# Patient Record
Sex: Male | Born: 1960 | Race: Black or African American | Hispanic: No | Marital: Married | Smoking: Never smoker
Health system: Southern US, Community
[De-identification: ages and names within clinical notes are randomized; demographics above are authoritative.]

## PROBLEM LIST (undated history)

## (undated) DIAGNOSIS — I10 Essential (primary) hypertension: Secondary | ICD-10-CM

## (undated) DIAGNOSIS — Z972 Presence of dental prosthetic device (complete) (partial): Secondary | ICD-10-CM

## (undated) DIAGNOSIS — M549 Dorsalgia, unspecified: Secondary | ICD-10-CM

## (undated) DIAGNOSIS — G609 Hereditary and idiopathic neuropathy, unspecified: Secondary | ICD-10-CM

## (undated) HISTORY — PX: BACK SURGERY: SHX140

---

## 2004-08-28 ENCOUNTER — Encounter: Payer: Self-pay | Admitting: Orthopedic Surgery

## 2004-09-04 ENCOUNTER — Encounter: Payer: Self-pay | Admitting: Orthopedic Surgery

## 2007-12-19 ENCOUNTER — Ambulatory Visit: Payer: Self-pay

## 2009-10-23 ENCOUNTER — Ambulatory Visit: Payer: Self-pay

## 2009-12-10 ENCOUNTER — Ambulatory Visit: Payer: Self-pay | Admitting: Pain Medicine

## 2009-12-23 ENCOUNTER — Ambulatory Visit: Payer: Self-pay | Admitting: Pain Medicine

## 2009-12-31 ENCOUNTER — Ambulatory Visit: Payer: Self-pay | Admitting: Pain Medicine

## 2010-01-14 ENCOUNTER — Ambulatory Visit: Payer: Self-pay | Admitting: Pain Medicine

## 2010-01-27 ENCOUNTER — Ambulatory Visit: Payer: Self-pay | Admitting: Pain Medicine

## 2010-04-01 ENCOUNTER — Ambulatory Visit: Payer: Self-pay | Admitting: Unknown Physician Specialty

## 2010-04-08 ENCOUNTER — Ambulatory Visit: Payer: Self-pay | Admitting: Unknown Physician Specialty

## 2010-05-14 ENCOUNTER — Encounter: Payer: Self-pay | Admitting: Surgery

## 2010-06-05 ENCOUNTER — Encounter: Payer: Self-pay | Admitting: Surgery

## 2011-02-02 ENCOUNTER — Ambulatory Visit: Payer: Self-pay | Admitting: Unknown Physician Specialty

## 2011-04-29 ENCOUNTER — Ambulatory Visit: Payer: Self-pay | Admitting: Unknown Physician Specialty

## 2011-04-29 LAB — URINALYSIS, COMPLETE
Bacteria: NONE SEEN
Bilirubin,UR: NEGATIVE
Blood: NEGATIVE
Glucose,UR: NEGATIVE mg/dL (ref 0–75)
Leukocyte Esterase: NEGATIVE
Nitrite: NEGATIVE
Ph: 7 (ref 4.5–8.0)
Protein: NEGATIVE
RBC,UR: NONE SEEN /HPF (ref 0–5)
Specific Gravity: 1.023 (ref 1.003–1.030)
Squamous Epithelial: <1

## 2011-04-29 LAB — BASIC METABOLIC PANEL
Anion Gap: 7 (ref 7–16)
Calcium, Total: 9.5 mg/dL (ref 8.5–10.1)
Chloride: 102 mmol/L (ref 98–107)
EGFR (African American): >60
EGFR (Non-African Amer.): >60
Glucose: 112 mg/dL — ABNORMAL HIGH (ref 65–99)
Osmolality: 283 (ref 275–301)
Potassium: 4.7 mmol/L (ref 3.5–5.1)
Sodium: 141 mmol/L (ref 136–145)

## 2011-04-29 LAB — CBC
HCT: 47.3 % (ref 40.0–52.0)
HGB: 15.8 g/dL (ref 13.0–18.0)
RBC: 5.53 10*6/uL (ref 4.40–5.90)
RDW: 12.6 % (ref 11.5–14.5)
WBC: 5.7 10*3/uL (ref 3.8–10.6)

## 2011-05-12 ENCOUNTER — Inpatient Hospital Stay: Payer: Self-pay | Admitting: Unknown Physician Specialty

## 2011-08-01 ENCOUNTER — Emergency Department: Payer: Self-pay | Admitting: Internal Medicine

## 2011-09-24 ENCOUNTER — Ambulatory Visit: Payer: Self-pay | Admitting: Orthopedic Surgery

## 2011-09-24 LAB — CREATININE, SERUM
Creatinine: 1.26 mg/dL (ref 0.60–1.30)
EGFR (Non-African Amer.): >60

## 2011-11-25 ENCOUNTER — Ambulatory Visit: Payer: Self-pay | Admitting: Pain Medicine

## 2011-12-04 ENCOUNTER — Ambulatory Visit: Payer: Self-pay | Admitting: Pain Medicine

## 2011-12-04 LAB — CREATININE, SERUM
Creatinine: 1.36 mg/dL — ABNORMAL HIGH (ref 0.60–1.30)
EGFR (African American): >60

## 2011-12-28 ENCOUNTER — Ambulatory Visit: Payer: Self-pay | Admitting: Pain Medicine

## 2012-01-26 ENCOUNTER — Ambulatory Visit: Payer: Self-pay | Admitting: Pain Medicine

## 2012-03-14 ENCOUNTER — Ambulatory Visit: Payer: Self-pay | Admitting: Pain Medicine

## 2012-03-20 ENCOUNTER — Ambulatory Visit: Payer: Self-pay | Admitting: Emergency Medicine

## 2012-03-20 LAB — RAPID STREP-A WITH REFLX: Micro Text Report: NEGATIVE

## 2012-03-22 LAB — BETA STREP CULTURE(ARMC)

## 2012-04-28 ENCOUNTER — Ambulatory Visit: Payer: Self-pay | Admitting: Pain Medicine

## 2012-05-16 ENCOUNTER — Ambulatory Visit: Payer: Self-pay | Admitting: Pain Medicine

## 2012-08-01 DIAGNOSIS — B182 Chronic viral hepatitis C: Secondary | ICD-10-CM | POA: Insufficient documentation

## 2012-09-12 DIAGNOSIS — M545 Low back pain: Secondary | ICD-10-CM | POA: Insufficient documentation

## 2012-09-12 DIAGNOSIS — G894 Chronic pain syndrome: Secondary | ICD-10-CM | POA: Insufficient documentation

## 2012-11-22 ENCOUNTER — Emergency Department: Payer: Self-pay | Admitting: Emergency Medicine

## 2012-12-08 ENCOUNTER — Emergency Department: Payer: Self-pay | Admitting: Emergency Medicine

## 2013-03-20 ENCOUNTER — Emergency Department: Payer: Self-pay | Admitting: Emergency Medicine

## 2013-03-24 ENCOUNTER — Emergency Department: Payer: Self-pay | Admitting: Emergency Medicine

## 2013-04-17 ENCOUNTER — Ambulatory Visit: Payer: Self-pay | Admitting: Pain Medicine

## 2013-05-01 ENCOUNTER — Other Ambulatory Visit: Payer: Self-pay | Admitting: Pain Medicine

## 2013-05-01 LAB — MAGNESIUM: MAGNESIUM: 1.9 mg/dL

## 2013-06-06 ENCOUNTER — Ambulatory Visit: Payer: Self-pay | Admitting: Pain Medicine

## 2013-11-22 ENCOUNTER — Emergency Department: Payer: Self-pay | Admitting: Emergency Medicine

## 2014-01-12 ENCOUNTER — Ambulatory Visit: Payer: Self-pay | Admitting: Orthopedic Surgery

## 2014-07-29 NOTE — Discharge Summary (Signed)
PATIENT NAME:  Seth Brown, Seth Brown MR#:  [id number] DATE OF BIRTH:  1960-12-31  DATE OF ADMISSION:  05/12/2011 DATE OF DISCHARGE:  05/14/2011  ADMITTING DIAGNOSIS: Degenerative disk disease with spondylolisthesis and spinal stenosis at L4-L5.   DISCHARGE DIAGNOSIS: Degenerative disk disease with spondylolisthesis and spinal stenosis at L4-L5.   OPERATION: On 05/12/2011, the patient had a transverse lumbar interbody fusion L4-L5 with insertion of an interbody device and nonsegmental spinal instrumentation with aspiration of iliac bone graft.   SURGEON: Ruthann Cancer, M.D.   ASSISTANT: Dedra Skeens, PA-C.   ANESTHESIA: General.   ESTIMATED BLOOD LOSS: 450 ML.  REPLACEMENT: 1,200 mL of crystalloid with 250 mL from cell saver.  COMPLICATIONS: None.  IMPLANTS USED: Zimmer trabecular metal Ardis 11 mm implant, CopiOs bone void filler, ST 360 degree pedicle screw with top-loading rods.   HISTORY: The patient is a 54 year old male who presented for persistent back pain and pain in the right leg and occasionally down the left. The patient is status post L4-L5 right microdiskectomy and L3-L4 diskectomy and decompression on 04/09/2011. The patient has been refractory to conservative management and continued to have pain.   PHYSICAL EXAMINATION: GENERAL: An alert male with some difficulties with transfers and mild limping component on the right. Cardiac examination was normal. Chest is clear. MUSCULOSKELETAL: In regard to the lumbar spine, the patient has decreased range of motion with pain with lateral rotation and bend and extension. The patient has pain with extension to his lower extremities. Bilateral lower extremity: the patient has positive straight leg raise at 45 degrees on the right, negative on the left. The patient has decreased sensation at the L5 nerve distribution on the right.   HOSPITAL COURSE: After initial admission on 05/12/2011, the patient was brought to the orthopedic floor. On  postoperative day one, the patient had a hemoglobin 13.4 which was stable. The patient worked with physical therapy ambulating to the bathroom and then ambulating up to 70 feet on day one that afternoon. The patient was ready to go home with home health physical therapy at least for one visit for safety check and using his back brace for ambulation.   DISCHARGE INSTRUCTIONS:  1. The patient will follow up at [facility] in about six days.  2. The patient will do physical therapy for at least one visit for safety and evaluation of the home. 3. The patient will do weight bear as tolerated. The patient will avoid bending, lifting, and twisting and avoid sitting upright and not crossing his legs.  4. The patient will try not to reach below his knees without twisting.  5. The patient will use knee-high TED hose.  6. The patient's diet is regular.  7. The patient is to leave his dressing on, but is able to shower and try not to get water under the dressing.  8. The patient will call the clinic if there is any bright red bleeding, calf pain, bowel or bladder difficulty, or any fever greater than 101.5.   DISCHARGE MEDICATIONS:  1. Os-Cal with vitamin D 1 tablet with each meal.  2. Resume typical home medications. 3. Tylenol 650/1,000 milligrams every six hours as needed for pain.  4. Oxycodone 10 mg every four hours as needed for severe pain.  5. The patient will also wear his lumbar support when he is up and ambulating.    ____________________________ J. Dedra Skeens, [place] jtm:ap D: 05/14/2011 07:42:07 ET T: 05/14/2011 08:11:24 ET JOB#: [id number]  cc: J. Dedra Skeens,  PA, <Dictator> J Madelina Sanda PA ELECTRONICALLY SIGNED 05/23/2011 5:55

## 2014-07-29 NOTE — Op Note (Signed)
PATIENT NAME:  Seth Brown, Seth Brown MR#:  [id number] DATE OF BIRTH:  Jun 17, 1960  DATE OF PROCEDURE:  05/12/2011  PREOPERATIVE DIAGNOSIS: Degenerative disk disease with spondylolisthesis and spinal stenosis at L4-L5.   POSTOPERATIVE DIAGNOSIS: Degenerative disk disease with spondylolisthesis and spinal stenosis at L4-L5.   PROCEDURES PERFORMED:  1. Transverse lumbar interbody fusion, L4-L5. 2. Posterolateral fusion, L4-L5.  3. Insertion of interbody device, L4-L5.  4. Nonsegmental spinal instrumentation, L4-L5.  5. Aspiration of iliac crest bone marrow from right iliac crest for fusion mass.   SURGEON: Winn Jock. Gerrit Heck, M.D.   ASSISTANT: Dedra Skeens, PA-C  ANESTHESIA: General.   ESTIMATED BLOOD LOSS: 450 mL.  REPLACEMENT: 1200 mL crystalloid, 250 mL from Cell Saver.   COMPLICATIONS: None.   IMPLANTS USED: Zimmer trabecular metal Ardis 11 mm implant, CopiOs bone void filler, ST 360 pedicle screws with top-loading rods.  Brief Clinical Note And Pathology: The patient had persistent back and radiating leg pain. He had had a prior procedure at L4-L5. Work-up showed evidence of progressive degenerative change with instability. Options, risks, and benefits were discussed in detail. At the time of the procedure, there was marked compression of the L5 nerve roots bilaterally. This was a combination of disk bulge and facet hypertrophy. The patient's bone was of good quality.   DESCRIPTION OF PROCEDURE: Preop antibiotics, adequate general anesthesia, prone position, all prominences well padded. Routine prepping and draping. Appropriate time out was called. The area of the incision was identified and opened in line from the prior incision with x-ray.  A midline incision was made. Subperiosteal dissection was performed. Self-retaining retractors were placed. Dissection was performed with the cautery and the Harmonic scalpel.   The self-retaining retractors were positioned throughout the procedure.    Laminectomy was performed at L4-L5. The facet joint on the left side was removed. Partial facetectomy and foraminotomy was performed on the right side. Decompression was performed bilaterally.   The disk space was then entered. Cleaning was performed with the pituitaries. Sizing was then performed with the sizer/endplate shaver. Size 11 gave excellent endplate to endplate contact. The 11 trial was used. Endplate shaving was performed.   Attention was then turned to the right iliac crest where using a bone marrow aspiration set bone marrow was aspirated. This was then mixed with the CopiOs, placed anteriorly in the disk space, and the Ardis implant was then inserted while protecting the soft tissues.   The incision was thoroughly irrigated. The nerve roots were again inspected.   The transverse processes were then inspected and cleaned. The pedicle was then inserted using the bur to began opening the pedicle followed by use of the PediGuard. The PediGuard advanced nicely, checked with fluoroscopic imaging.   Sounding was then performed. The pedicle screws were inserted at each level. All had excellent purchase. Position was good, checked on AP and lateral views.   Attention was then turned to the top-loading rods which were placed without difficulty. The transverse processes which were decorticated were then covered with CopiOs that had been soaked in bone marrow aspirate. The bone which had been removed locally was placed in the bone mill and morselized. This was then packed into the posterolateral gutters. The incision was thoroughly irrigated throughout and at the end of the procedure. The exposed dura and nerve roots were carefully inspected, hemostasis was good. These were then covered with Surgiflo. The fascia was closed with #2 quill. The subcutaneous was closed with zero quill, and the skin was closed with  staples. A soft sterile dressing was applied. Sponge, instrument, and needle     counts were reported as correct prior to and after wound closure. The patient was awakened and taken to the PAC-U having tolerated the procedure well.  ____________________________ Winn JockJames C. Gerrit Heckaliff, MD jcc:slb D: 05/12/2011 13:22:42 ET T: 05/12/2011 14:32:49 ET JOB#: [id number]  cc: Winn JockJames C. Gerrit Heckaliff, MD, <Dictator> Winn JockJAMES C Shaunak Kreis MD ELECTRONICALLY SIGNED 05/19/2011 19:33

## 2014-09-21 ENCOUNTER — Emergency Department
Admission: EM | Admit: 2014-09-21 | Discharge: 2014-09-21 | Disposition: A | Discharge status: Home / Self Care | Payer: Medicaid Other | Attending: Emergency Medicine | Admitting: Emergency Medicine

## 2014-09-21 ENCOUNTER — Encounter: Payer: Self-pay | Admitting: Emergency Medicine

## 2014-09-21 DIAGNOSIS — F1010 Alcohol abuse, uncomplicated: Secondary | ICD-10-CM | POA: Diagnosis not present

## 2014-09-21 DIAGNOSIS — F1410 Cocaine abuse, uncomplicated: Secondary | ICD-10-CM | POA: Insufficient documentation

## 2014-09-21 DIAGNOSIS — F1910 Other psychoactive substance abuse, uncomplicated: Secondary | ICD-10-CM

## 2014-09-21 LAB — URINE DRUG SCREEN, QUALITATIVE (ARMC ONLY)
Amphetamines, Ur Screen: NOT DETECTED
Barbiturates, Ur Screen: NOT DETECTED
Benzodiazepine, Ur Scrn: NOT DETECTED
CANNABINOID 50 NG, UR ~~LOC~~: NOT DETECTED
COCAINE METABOLITE, UR ~~LOC~~: POSITIVE — AB
MDMA (Ecstasy)Ur Screen: NOT DETECTED
Methadone Scn, Ur: NOT DETECTED
OPIATE, UR SCREEN: NOT DETECTED
Phencyclidine (PCP) Ur S: NOT DETECTED
Tricyclic, Ur Screen: NOT DETECTED

## 2014-09-21 LAB — COMPREHENSIVE METABOLIC PANEL
ALT: 95 U/L — AB (ref 17–63)
AST: 99 U/L — ABNORMAL HIGH (ref 15–41)
Albumin: 4.5 g/dL (ref 3.5–5.0)
Alkaline Phosphatase: 47 U/L (ref 38–126)
Anion gap: 9 (ref 5–15)
BILIRUBIN TOTAL: 0.9 mg/dL (ref 0.3–1.2)
BUN: 10 mg/dL (ref 6–20)
CALCIUM: 9.4 mg/dL (ref 8.9–10.3)
CHLORIDE: 106 mmol/L (ref 101–111)
CO2: 25 mmol/L (ref 22–32)
Creatinine, Ser: 1.24 mg/dL (ref 0.61–1.24)
GFR calc Af Amer: >60 mL/min (ref >60)
Glucose, Bld: 142 mg/dL — ABNORMAL HIGH (ref 65–99)
POTASSIUM: 3.9 mmol/L (ref 3.5–5.1)
SODIUM: 140 mmol/L (ref 135–145)
Total Protein: 8 g/dL (ref 6.5–8.1)

## 2014-09-21 LAB — CBC
HEMATOCRIT: 45.4 % (ref 40.0–52.0)
Hemoglobin: 14.7 g/dL (ref 13.0–18.0)
MCH: 27.4 pg (ref 26.0–34.0)
MCHC: 32.4 g/dL (ref 32.0–36.0)
MCV: 84.8 fL (ref 80.0–100.0)
Platelets: 256 10*3/uL (ref 150–440)
RBC: 5.35 MIL/uL (ref 4.40–5.90)
RDW: 13.8 % (ref 11.5–14.5)
WBC: 6.7 10*3/uL (ref 3.8–10.6)

## 2014-09-21 LAB — ETHANOL: Alcohol, Ethyl (B): 6 mg/dL — ABNORMAL HIGH (ref <5)

## 2014-09-21 NOTE — Discharge Instructions (Signed)
Alcohol Withdrawal °Anytime drug use is interfering with normal living activities it has become abuse. This includes problems with family and friends. Psychological dependence has developed when your mind tells you that the drug is needed. This is usually followed by physical dependence when a continuing increase of drugs are required to get the same feeling or "high." This is known as addiction or chemical dependency. A person's risk is much higher if there is a history of chemical dependency in the family. °Mild Withdrawal Following Stopping Alcohol, When Addiction or Chemical Dependency Has Developed °When a person has developed tolerance to alcohol, any sudden stopping of alcohol can cause uncomfortable physical symptoms. Most of the time these are mild and consist of tremors in the hands and increases in heart rate, breathing, and temperature. Sometimes these symptoms are associated with anxiety, panic attacks, and bad dreams. There may also be stomach upset. Normal sleep patterns are often interrupted with periods of inability to sleep (insomnia). This may last for 6 months. Because of this discomfort, many people choose to continue drinking to get rid of this discomfort and to try to feel normal. °Severe Withdrawal with Decreased or No Alcohol Intake, When Addiction or Chemical Dependency Has Developed °About five percent of alcoholics will develop signs of severe withdrawal when they stop using alcohol. One sign of this is development of generalized seizures (convulsions). Other signs of this are severe agitation and confusion. This may be associated with believing in things which are not real or seeing things which are not really there (delusions and hallucinations). Vitamin deficiencies are usually present if alcohol intake has been long-term. Treatment for this most often requires hospitalization and close observation. °Addiction can only be helped by stopping use of all chemicals. This is hard but may  save your life. With continual alcohol use, possible outcomes are usually loss of self respect and esteem, violence, and death. °Addiction cannot be cured but it can be stopped. This often requires outside help and the care of professionals. Treatment centers are listed in the yellow pages under Cocaine, Narcotics, and Alcoholics Anonymous. Most hospitals and clinics can refer you to a specialized care center. °It is not necessary for you to go through the uncomfortable symptoms of withdrawal. Your caregiver can provide you with medicines that will help you through this difficult period. Try to avoid situations, friends, or drugs that made it possible for you to keep using alcohol in the past. Learn how to say no. °It takes a long period of time to overcome addictions to all drugs, including alcohol. There may be many times when you feel as though you want a drink. After getting rid of the physical addiction and withdrawal, you will have a lessening of the craving which tells you that you need alcohol to feel normal. Call your caregiver if more support is needed. Learn who to talk to in your family and among your friends so that during these periods you can receive outside help. Alcoholics Anonymous (AA) has helped many people over the years. To get further help, contact AA or call your caregiver, counselor, or clergyperson. Al-Anon and Alateen are support groups for friends and family members of an alcoholic. The people who love and care for an alcoholic often need help, too. For information about these organizations, check your phone directory or call a local alcoholism treatment center.  °SEEK IMMEDIATE MEDICAL CARE IF:  °· You have a seizure. °· You have a fever. °· You experience uncontrolled vomiting or you   vomit up blood. This may be bright red or look like black coffee grounds. °· You have blood in the stool. This may be bright red or appear as a black, tarry, bad-smelling stool. °· You become lightheaded or  faint. Do not drive if you feel this way. Have someone else drive you or call 911 for help. °· You become more agitated or confused. °· You develop uncontrolled anxiety. °· You begin to see things that are not really there (hallucinate). °Your caregiver has determined that you completely understand your medical condition, and that your mental state is back to normal. You understand that you have been treated for alcohol withdrawal, have agreed not to drink any alcohol for a minimum of 1 day, will not operate a car or other machinery for 24 hours, and have had an opportunity to ask any questions about your condition. °Document Released: 12/31/2004 Document Revised: 06/15/2011 Document Reviewed: 11/09/2007 °ExitCare® Patient Information ©2015 [employer]. This information is not intended to replace advice given to you by your health care provider. Make sure you discuss any questions you have with your health care provider. ° °

## 2014-09-21 NOTE — ED Notes (Signed)
BEHAVIORAL HEALTH ROUNDING Patient sleeping: Yes.   Patient alert and oriented: yes Behavior appropriate: Yes.  ; If no, describe:  Nutrition and fluids offered: No Toileting and hygiene offered: No Sitter present: yes Law enforcement present: Yes  

## 2014-09-21 NOTE — ED Notes (Signed)
BEHAVIORAL HEALTH ROUNDING Patient sleeping: No. Patient alert and oriented: yes Behavior appropriate: Yes.  ; If no, describe:  Nutrition and fluids offered: Yes  Toileting and hygiene offered: Yes  Sitter present: yes Law enforcement present: Yes  

## 2014-09-21 NOTE — ED Notes (Signed)
Pt informed to return if any life threatening symptoms occur. Also informed to return if pt began to have any SI/HI.

## 2014-09-21 NOTE — ED Notes (Signed)
States he wants detox from crack.  Skin w/d, cooperative.

## 2014-09-21 NOTE — BH Assessment (Signed)
Assessment Note  Seth Brown is an 54 y.o. male who presents to ER seeking assistance with his alcohol and cocaine use. He states, he uses on a daily basis and the longest he has been clean is 90 days. He was on treatment in the past but didn't follow up without patient treatment. He denies having any medical problems or concerns. Has no history of blackout or seizures. Current withdrawal symptoms are; shakes, some dizziness, cold chills and nausea.   He denies any involvement with the legal system. He also denies SI/HI and AV/H.      Axis I: Alcohol Abuse and Substance Abuse Axis III: History reviewed. No pertinent past medical history. Axis IV: economic problems, occupational problems, other psychosocial or environmental problems, problems related to social environment and problems with primary support group  Past Medical History: History reviewed. No pertinent past medical history.  Past Surgical History  Procedure Laterality Date  . Back surgery      Family History: History reviewed. No pertinent family history.  Social History:  reports that he has never smoked. He does not have any smokeless tobacco history on file. He reports that he drinks alcohol. His drug history is not on file.  Additional Social History:  Alcohol / Drug Use Pain Medications: None Reported Prescriptions: None Reported Over the Counter: None Reported History of alcohol / drug use?: Yes Longest period of sobriety (when/how long): None Reported Negative Consequences of Use: Financial, Personal relationships, Work / Programmer, multimedia Withdrawal Symptoms: Agitation, Sweats, Fever / Chills, Nausea / Vomiting Substance #1 Name of Substance 1: Cocaine 1 - Age of First Use: 20 1 - Amount (size/oz): "8 Ball" 1 - Frequency: Daily 1 - Duration: 24 years 1 - Last Use / Amount: 09/20/2014 Substance #2 Name of Substance 2: Alcohol 2 - Age of First Use: 18 2 - Amount (size/oz): 3 to 4, 6 packs 2 - Frequency:  Daily 2 - Duration: 20 years 2 - Last Use / Amount: 09/20/2014  CIWA: CIWA-Ar BP: (!) 130/91 mmHg Pulse Rate: 80 COWS:    Allergies: Allergies no known allergies  Home Medications:  (Not in a hospital admission)  OB/GYN Status:  No LMP for male patient.  General Assessment Data Location of Assessment: [facility] ED TTS Assessment: In system Is this a Tele or Face-to-Face Assessment?: Face-to-Face Is this an Initial Assessment or a Re-assessment for this encounter?: Initial Assessment Marital status: Married [place] name: n/a Is patient pregnant?: No Pregnancy Status: No Living Arrangements: Spouse/significant other Can pt return to current living arrangement?: Yes Admission Status: Voluntary Is patient capable of signing voluntary admission?: Yes Referral Source: Self/Family/Friend Insurance type: Medicaid  Medical Screening Exam [facility] Walk-in ONLY) Medical Exam completed: Yes  Crisis Care Plan Living Arrangements: Spouse/significant other Name of Psychiatrist: n/a Name of Therapist: n/a  Education Status Is patient currently in school?: No Current Grade: n/a Highest grade of school patient has completed: 12 Name of school: n/a Contact person: n/a  Risk to self with the past 6 months Suicidal Ideation: No Has patient been a risk to self within the past 6 months prior to admission? : No Suicidal Intent: No Has patient had any suicidal intent within the past 6 months prior to admission? : No Is patient at risk for suicide?: No Suicidal Plan?: No Has patient had any suicidal plan within the past 6 months prior to admission? : No Access to Means: No What has been your use of drugs/alcohol within the last 12 months?: Cocaine &  Alcohol Previous Attempts/Gestures: No How many times?: 0 Other Self Harm Risks: n/a Triggers for Past Attempts: None known Intentional Self Injurious Behavior: None Family Suicide History: No Recent stressful life event(s): Conflict (Comment),  Other (Comment) Persecutory voices/beliefs?: No Depression: No Depression Symptoms: Feeling worthless/self pity, Feeling angry/irritable, Guilt Substance abuse history and/or treatment for substance abuse?: Yes ([facility] 2006) Suicide prevention information given to non-admitted patients: Not applicable  Risk to Others within the past 6 months Homicidal Ideation: No Does patient have any lifetime risk of violence toward others beyond the six months prior to admission? : No Thoughts of Harm to Others: No Current Homicidal Intent: No Current Homicidal Plan: No Access to Homicidal Means: No Identified Victim: None Reported History of harm to others?: No Assessment of Violence: None Noted Violent Behavior Description: None Reported Does patient have access to weapons?: No Criminal Charges Pending?: No Does patient have a court date: No Is patient on probation?: No  Psychosis Hallucinations: None noted Delusions: None noted  Mental Status Report Appearance/Hygiene: Unremarkable, In scrubs, In hospital gown Eye Contact: Good Motor Activity: Freedom of movement Speech: Logical/coherent Level of Consciousness: Alert, Irritable Mood: Anxious, Irritable, Pleasant Affect: Anxious, Depressed, Appropriate to circumstance Anxiety Level: Severe Thought Processes: Coherent, Relevant Judgement: Unimpaired Orientation: Person, Place, Time, Situation, Appropriate for developmental age Obsessive Compulsive Thoughts/Behaviors: None  Cognitive Functioning Concentration: Normal Memory: Remote Intact, Recent Intact IQ: Average Insight: Poor Impulse Control: Poor Appetite: Fair Weight Loss: 0 Weight Gain: 0 Sleep: No Change Total Hours of Sleep: 8 Vegetative Symptoms: None  ADLScreening [facility] Assessment Services) Patient's cognitive ability adequate to safely complete daily activities?: Yes Patient able to express need for assistance with ADLs?: Yes Independently performs ADLs?: Yes  (appropriate for developmental age)  Prior Inpatient Therapy Prior Inpatient Therapy: Yes Prior Therapy Dates: 2006 Prior Therapy Facilty/Provider(s): [facility] Reason for Treatment: SA Treatment  Prior Outpatient Therapy Prior Outpatient Therapy: No Prior Therapy Dates: n/a Prior Therapy Facilty/Provider(s): n/a Reason for Treatment: n/a Does patient have an ACCT team?: No Does patient have Intensive In-House Services?  : No Does patient have [facility] services? : No Does patient have P4CC services?: No  ADL Screening (condition at time of admission) Patient's cognitive ability adequate to safely complete daily activities?: Yes Patient able to express need for assistance with ADLs?: Yes Independently performs ADLs?: Yes (appropriate for developmental age)       Abuse/Neglect Assessment (Assessment to be complete while patient is alone) Physical Abuse: Denies Verbal Abuse: Denies Sexual Abuse: Denies Exploitation of patient/patient's resources: Denies Self-Neglect: Denies Values / Beliefs Cultural Requests During Hospitalization: None Spiritual Requests During Hospitalization: None Consults Spiritual Care Consult Needed: No Social Work Consult Needed: No Merchant navy officer (For Healthcare) Does patient have an advance directive?: No    Additional Information 1:1 In Past 12 Months?: No CIRT Risk: No Elopement Risk: No Does patient have medical clearance?: Yes  Child/Adolescent Assessment Running Away Risk: Denies (Pt. is an adult)  Disposition:  Disposition Initial Assessment Completed for this Encounter: Yes Disposition of Patient: Referred to Patient referred to: [facility]  On Site Evaluation by:   Reviewed with Physician:    Lilyan Gilford, MS, LCAS, LPC, NCC, CCSI 09/21/2014 2:26 PM

## 2014-09-21 NOTE — ED Provider Notes (Signed)
[facility] Emergency Department Provider Note  Time seen: 9:11 AM  I have reviewed the triage vital signs and the nursing notes.   HISTORY  Chief Complaint Drug Problem    HPI Harvie Morua is a 54 y.o. male with no past medical history presents the emergency department, hoping to detox off alcohol and cocaine. According to the patient he has been smoking crack cocaine every day for several weeks now. 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 He also states daily drinking, approximately 6-12 beers per day. Denies any history of DTs or seizures. Patient's last use of crack cocaine and alcohol was last night. Patient denies any SI or HI, here voluntarily hoping for detox.     History reviewed. No pertinent past medical history.  There are no active problems to display for this patient.   Past Surgical History  Procedure Laterality Date  . Back surgery      No current outpatient prescriptions on file.  Allergies Review of patient's allergies indicates no known allergies.  History reviewed. No pertinent family history.  Social History History  Substance Use Topics  . Smoking status: Never Smoker   . Smokeless tobacco: Not on file  . Alcohol Use: Yes    Review of Systems Constitutional: Negative for fever. Cardiovascular: Negative for chest pain. Respiratory: Negative for shortness of breath. Gastrointestinal: Negative for abdominal pain Musculoskeletal: Negative for back pain. Neurological: Negative for headache Psychiatric:Denies SI or HI.  10-point ROS otherwise negative.  ____________________________________________   PHYSICAL EXAM:  VITAL SIGNS: ED Triage Vitals  Enc Vitals Group     BP 09/21/14 0845 130/91 mmHg     Pulse Rate 09/21/14 0845 80     Resp 09/21/14 0845 16     Temp 09/21/14 0845 98.1 F (36.7 C)     Temp Source 09/21/14 0845 Oral     SpO2 09/21/14 0845 95 %     Weight 09/21/14 0845 180 lb  (81.647 kg)     Height 09/21/14 0845  (1.778 m)     Head Cir --      Peak Flow --      Pain Score --      Pain Loc --      Pain Edu? --      Excl. in GC? --     Constitutional: Alert and oriented. Well appearing and in no distress. Eyes: Normal exam ENT   Head: Normocephalic and atraumatic. Cardiovascular: Normal rate, regular rhythm. No murmur Respiratory: Normal respiratory effort without tachypnea nor retractions. Breath sounds are clear Gastrointestinal: Soft and nontender. No distention.  Musculoskeletal: Nontender with normal range of motion in all extremities.  Neurologic:  Normal speech and language. No gross focal neurologic deficits Skin:  Skin is warm, dry and intact.  Psychiatric: Mood and affect are normal. Speech and behavior are normal.  ____________________________________________    INITIAL IMPRESSION / ASSESSMENT AND PLAN / ED COURSE  Pertinent labs & imaging results that were available during my care of the patient were reviewed by me and considered in my medical decision making (see chart for details).  Patient with no medical problems presents to the emergency department hoping for detox from alcohol and crack cocaine. We will check labs, and discuss with behavioral health for detox availability. Patient agreeable to plan, will place on [place] precautions at this time.  Patient no longer wishes for detox placement, and is asking to be discharged home. We'll discharge the patient  with primary care follow-up.  ____________________________________________   FINAL CLINICAL IMPRESSION(S) / ED DIAGNOSES  Alcohol abuse Substance abuse   Minna Antis, MD 09/21/14 1504

## 2014-12-30 ENCOUNTER — Emergency Department
Admission: EM | Admit: 2014-12-30 | Discharge: 2014-12-30 | Disposition: A | Discharge status: Home / Self Care | Payer: Medicaid Other | Attending: Emergency Medicine | Admitting: Emergency Medicine

## 2014-12-30 ENCOUNTER — Emergency Department: Payer: Medicaid Other

## 2014-12-30 ENCOUNTER — Encounter: Payer: Self-pay | Admitting: *Deleted

## 2014-12-30 DIAGNOSIS — Y9389 Activity, other specified: Secondary | ICD-10-CM | POA: Insufficient documentation

## 2014-12-30 DIAGNOSIS — X58XXXA Exposure to other specified factors, initial encounter: Secondary | ICD-10-CM | POA: Diagnosis not present

## 2014-12-30 DIAGNOSIS — Y998 Other external cause status: Secondary | ICD-10-CM | POA: Diagnosis not present

## 2014-12-30 DIAGNOSIS — Y9289 Other specified places as the place of occurrence of the external cause: Secondary | ICD-10-CM | POA: Insufficient documentation

## 2014-12-30 DIAGNOSIS — S46011A Strain of muscle(s) and tendon(s) of the rotator cuff of right shoulder, initial encounter: Secondary | ICD-10-CM | POA: Diagnosis not present

## 2014-12-30 DIAGNOSIS — Z791 Long term (current) use of non-steroidal anti-inflammatories (NSAID): Secondary | ICD-10-CM | POA: Diagnosis not present

## 2014-12-30 DIAGNOSIS — S4991XA Unspecified injury of right shoulder and upper arm, initial encounter: Secondary | ICD-10-CM | POA: Diagnosis present

## 2014-12-30 MED ORDER — MELOXICAM 15 MG PO TABS: 15.0000 mg | ORAL_TABLET | Freq: Every day | ORAL | Status: DC

## 2014-12-30 NOTE — ED Provider Notes (Signed)
[facility] Emergency Department Provider Note  ____________________________________________  Time seen: Approximately 7:48 AM  I have reviewed the triage vital signs and the nursing notes.   HISTORY  Chief Complaint Shoulder Pain   HPI Seth Brown is a 54 y.o. male who presents to the emergency department for evaluation of anterior right shoulder pain. He denies specific injury, however states that he has been lifting weights recently and believes it may be related. He denies previous shoulder injuries.   No past medical history on file.  There are no active problems to display for this patient.   Past Surgical History  Procedure Laterality Date  . Back surgery      Current Outpatient Rx  Name  Route  Sig  Dispense  Refill  . naproxen sodium (ANAPROX) 220 MG tablet   Oral   Take 220 mg by mouth 3 (three) times daily with meals.         . meloxicam (MOBIC) 15 MG tablet   Oral   Take 1 tablet (15 mg total) by mouth daily.   30 tablet   2     Allergies Review of patient's allergies indicates no known allergies.  No family history on file.  Social History Social History  Substance Use Topics  . Smoking status: Never Smoker   . Smokeless tobacco: Never Used  . Alcohol Use: No    Review of Systems Constitutional: No recent illness. Eyes: No visual changes. ENT: No sore throat. Cardiovascular: Denies chest pain or palpitations. Respiratory: Denies shortness of breath. Gastrointestinal: No abdominal pain.  Genitourinary: Negative for dysuria. Musculoskeletal: Pain in right shoulder Skin: Negative for rash. Neurological: Negative for headaches, focal weakness or numbness. 10-point ROS otherwise negative.  ____________________________________________   PHYSICAL EXAM:  VITAL SIGNS: ED Triage Vitals  Enc Vitals Group     BP 12/30/14 0616 128/96 mmHg     Pulse Rate 12/30/14 0616 66     Resp 12/30/14 0616 16     Temp  12/30/14 0616 97.7 F (36.5 C)     Temp Source 12/30/14 0616 Oral     SpO2 12/30/14 0616 100 %     Weight 12/30/14 0616 178 lb (80.74 kg)     Height 12/30/14 0616  (1.778 m)     Head Cir --      Peak Flow --      Pain Score 12/30/14 0616 9     Pain Loc --      Pain Edu? --      Excl. in GC? --     Constitutional: Alert and oriented. Well appearing and in no acute distress. Eyes: Conjunctivae are normal. EOMI. Head: Atraumatic. Nose: No congestion/rhinnorhea. Neck: No stridor.  Respiratory: Normal respiratory effort.   Musculoskeletal: Anterior shoulder pain worse with abduction. No stepdown deformity noted Neurologic:  Normal speech and language. No gross focal neurologic deficits are appreciated. Speech is normal. No gait instability. Skin:  Skin is warm, dry and intact. Atraumatic. Psychiatric: Mood and affect are normal. Speech and behavior are normal.  ____________________________________________   LABS (all labs ordered are listed, but only abnormal results are displayed)  Labs Reviewed - No data to display ____________________________________________  RADIOLOGY  Not indicated ____________________________________________   PROCEDURES  Procedure(s) performed: None   ____________________________________________   INITIAL IMPRESSION / ASSESSMENT AND PLAN / ED COURSE  Pertinent labs & imaging results that were available during my care of the patient were reviewed by me and considered in  my medical decision making (see chart for details).  Patient was advised to decrease the amount of weight he is lifting on the right side until pain is relieved or he is cleared by orthopedics. He was advised to follow-up with orthopedics for symptoms that are not improving over the next week. He'll be given a prescription for Mobic. ____________________________________________   FINAL CLINICAL IMPRESSION(S) / ED DIAGNOSES  Final diagnoses:  Rotator cuff strain, right,  initial encounter       Chinita Pester, FNP 12/30/14 1541  Sharman Cheek, MD 12/30/14 [phone]

## 2014-12-30 NOTE — ED Notes (Signed)
Pt states he believes he injured R shoulder when weight lifting last Saturday. Pt states pain not improving and no relief from OTC medications like Aleve or topical preparations like Icyhot. Pt states pain is worse when crossing his R arm over his midline, raising it straight above his head or putting pressure on his arm in a vertical direction.

## 2015-04-10 DIAGNOSIS — I10 Essential (primary) hypertension: Secondary | ICD-10-CM | POA: Insufficient documentation

## 2015-09-11 ENCOUNTER — Telehealth: Payer: Self-pay | Admitting: Emergency Medicine

## 2015-12-25 ENCOUNTER — Emergency Department
Admission: EM | Admit: 2015-12-25 | Discharge: 2015-12-25 | Disposition: A | Discharge status: Home / Self Care | Payer: Medicaid Other | Attending: Emergency Medicine | Admitting: Emergency Medicine

## 2015-12-25 ENCOUNTER — Encounter: Payer: Self-pay | Admitting: *Deleted

## 2015-12-25 DIAGNOSIS — M7711 Lateral epicondylitis, right elbow: Secondary | ICD-10-CM | POA: Insufficient documentation

## 2015-12-25 DIAGNOSIS — M25521 Pain in right elbow: Secondary | ICD-10-CM | POA: Diagnosis present

## 2015-12-25 MED ORDER — NAPROXEN 500 MG PO TABS: 500.0000 mg | ORAL_TABLET | Freq: Two times a day (BID) | ORAL | 0 refills | Status: DC

## 2015-12-25 NOTE — ED Notes (Signed)
Pt states right arm pain for 1 month, denies any known injury, pt awake and alert in no distress

## 2015-12-25 NOTE — ED Triage Notes (Signed)
Pt arrived to ED reporting right arm pain x 1 month. Pt reports he works out but is unsure if this pain began after a workout. Pain noted in right forearm and is reported to be constant. Pt reports decreased strength over the past month. Pt in NAD. No swelling noted and no increased pain with palpation.

## 2015-12-25 NOTE — ED Provider Notes (Signed)
[facility] Emergency Department Provider Note  ____________________________________________  Time seen: Approximately 12:27 PM  I have reviewed the triage vital signs and the nursing notes.   HISTORY  Chief Complaint Arm Injury    HPI Seth Brown is a 55 y.o. male , NAD, presents to the emergency department with 1 month history of right elbow and forearm pain. States he is uncertain of any specific injury or trauma to the elbow or arm. It is also uncertain of how the pain began. He just notes that he has had pain in the right elbow and arm for approximately 1 month. Seems to be worse with gripping and when he completes his weightlifting routine. States he works out on a daily basis including weightlifting and has done so for many years. States that he took 3 days off from the gentleman weight lifting but when he resumed his weightlifting activities the pain persisted. Is taking over-the-counter Advil and Motrin without any resolution of pain. Has not noted any redness, swelling, abnormal warmth, skin sores, open wounds, numbness, weakness, tingling. Denies any chest pain or shortness of breath.   History reviewed. No pertinent past medical history.  There are no active problems to display for this patient.   Past Surgical History:  Procedure Laterality Date  . BACK SURGERY      Prior to Admission medications   Medication Sig Start Date End Date Taking? Authorizing Provider  meloxicam (MOBIC) 15 MG tablet Take 1 tablet (15 mg total) by mouth daily. 12/30/14   Chinita Pester, FNP  naproxen (NAPROSYN) 500 MG tablet Take 1 tablet (500 mg total) by mouth 2 (two) times daily with a meal. 12/25/15   Jami L Hagler, PA-C  naproxen sodium (ANAPROX) 220 MG tablet Take 220 mg by mouth 3 (three) times daily with meals.    Historical Provider, MD    Allergies Review of patient's allergies indicates no known allergies.  History reviewed. No pertinent family  history.  Social History Social History  Substance Use Topics  . Smoking status: Never Smoker  . Smokeless tobacco: Never Used  . Alcohol use No     Review of Systems  Constitutional: No fever/chills Cardiovascular: No chest pain. Respiratory:  No shortness of breath.  Musculoskeletal: Positive right elbow and forearm pain. No shoulder or hand pain.  Skin: Negative for rash, Redness, abnormal warmth, skin sores, open wounds. Neurological: Negative for illness, weakness, tingling. 10-point ROS otherwise negative.  ____________________________________________   PHYSICAL EXAM:  VITAL SIGNS: ED Triage Vitals  Enc Vitals Group     BP 12/25/15 1141 122/78     Pulse Rate 12/25/15 1141 77     Resp 12/25/15 1141 16     Temp 12/25/15 1141 98.1 F (36.7 C)     Temp Source 12/25/15 1141 Oral     SpO2 12/25/15 1141 99 %     Weight 12/25/15 1142 178 lb (80.7 kg)     Height 12/25/15 1142 5\' 10"  (1.778 m)     Head Circumference --      Peak Flow --      Pain Score 12/25/15 1142 6     Pain Loc --      Pain Edu? --      Excl. in GC? --      Constitutional: Alert and oriented. Well appearing and in no acute distress. Eyes: Conjunctivae are normal.  Head: Atraumatic. Cardiovascular: Good peripheral circulation With 2+ pulses noted about the right upper extremity. Capillary  refill is brisk in all digits of the right hand Respiratory: Normal respiratory effort without tachypnea or retractions.  Musculoskeletal: Tenderness to palpation about the right lateral epicondyles and proximal lateral forearm. No step-offs or deformities noted. Mild pain with pronation and supination that is alleviated with pressure applied around the proximal forearm. No joint effusions. Neurologic:  Normal speech and language. No gross focal neurologic deficits are appreciated. Sensation to light touch grossly intact about the right upper extremity Skin:  Skin is warm, dry and intact. No rash, redness, abnormal  warmth, skin sores, open wounds noted. Psychiatric: Mood and affect are normal. Speech and behavior are normal. Patient exhibits appropriate insight and judgement.   ____________________________________________   LABS  None ____________________________________________  EKG  None ____________________________________________  RADIOLOGY  None ____________________________________________    PROCEDURES  Procedure(s) performed: None   Procedures   Medications - No data to display   ____________________________________________   INITIAL IMPRESSION / ASSESSMENT AND PLAN / ED COURSE  Pertinent labs & imaging results that were available during my care of the patient were reviewed by me and considered in my medical decision making (see chart for details).  Clinical Course    Patient's diagnosis is consistent with Right lateral epicondylitis. Patient will be discharged home with prescriptions for naproxen to take as directed. Patient is to purchase a tennis elbow strap at a medical supply store or local pharmacy and utilize on the right forearm over the next week. Advised the patient that he should not weight left with the right arm over the next week to allow healing. Patient is to follow up with Dr. Ernest PineHooten in orthopedics if symptoms persist past this treatment course. Patient is given ED precautions to return to the ED for any worsening or new symptoms.    ____________________________________________  FINAL CLINICAL IMPRESSION(S) / ED DIAGNOSES  Final diagnoses:  Lateral epicondylitis, right      NEW MEDICATIONS STARTED DURING THIS VISIT:  Discharge Medication List as of 12/25/2015 12:34 PM    START taking these medications   Details  naproxen (NAPROSYN) 500 MG tablet Take 1 tablet (500 mg total) by mouth 2 (two) times daily with a meal., Starting Wed 12/25/2015, Print             Ernestene KielJami L FentonHagler, PA-C 12/25/15 1531    Jennye MoccasinBrian S Quigley, MD 12/26/15  1750

## 2015-12-25 NOTE — Discharge Instructions (Signed)
Please purchase a TENNIS ELBOW STRAP to use on the right forearm as discussed.

## 2016-01-24 ENCOUNTER — Emergency Department
Admission: EM | Admit: 2016-01-24 | Discharge: 2016-01-24 | Disposition: A | Discharge status: Home / Self Care | Payer: Medicaid Other | Attending: Emergency Medicine | Admitting: Emergency Medicine

## 2016-01-24 ENCOUNTER — Encounter: Payer: Self-pay | Admitting: Emergency Medicine

## 2016-01-24 DIAGNOSIS — Z791 Long term (current) use of non-steroidal anti-inflammatories (NSAID): Secondary | ICD-10-CM | POA: Insufficient documentation

## 2016-01-24 DIAGNOSIS — J029 Acute pharyngitis, unspecified: Secondary | ICD-10-CM | POA: Diagnosis not present

## 2016-01-24 LAB — POCT RAPID STREP A: STREPTOCOCCUS, GROUP A SCREEN (DIRECT): NEGATIVE

## 2016-01-24 MED ORDER — AMOXICILLIN 500 MG PO TABS: 500.0000 mg | ORAL_TABLET | Freq: Two times a day (BID) | ORAL | 0 refills | Status: DC

## 2016-01-24 NOTE — ED Notes (Signed)
.  Swab done

## 2016-01-24 NOTE — ED Triage Notes (Signed)
Pt reports sore throat x 5 days 

## 2016-01-24 NOTE — ED Provider Notes (Signed)
[facility] Emergency Department Provider Note  ____________________________________________  Time seen: Approximately 3:12 PM  I have reviewed the triage vital signs and the nursing notes.   HISTORY  Chief Complaint Sore Throat  HPI Seth Brown is a 55 y.o. male who presents to the emergency department for evaluation of sore throat for the past 5 days. He has not taken anything for pain. He denies fever or other symptoms. No known sick exposures.   History reviewed. No pertinent past medical history.  There are no active problems to display for this patient.   Past Surgical History:  Procedure Laterality Date  . BACK SURGERY      Prior to Admission medications   Medication Sig Start Date End Date Taking? Authorizing Provider  meloxicam (MOBIC) 15 MG tablet Take 1 tablet (15 mg total) by mouth daily. 12/30/14   Chinita Pesterari B Eesa Justiss, FNP  naproxen (NAPROSYN) 500 MG tablet Take 1 tablet (500 mg total) by mouth 2 (two) times daily with a meal. 12/25/15   Jami L Hagler, PA-C  naproxen sodium (ANAPROX) 220 MG tablet Take 220 mg by mouth 3 (three) times daily with meals.    Historical Provider, MD    Allergies Review of patient's allergies indicates no known allergies.  No family history on file.  Social History Social History  Substance Use Topics  . Smoking status: Never Smoker  . Smokeless tobacco: Never Used  . Alcohol use No    Review of Systems Constitutional: Negative for fever. Eyes: No visual changes. ENT: Positive for sore throat; negative for difficulty swallowing. Respiratory: Denies shortness of breath. Gastrointestinal: No abdominal pain.  No nausea, no vomiting.  No diarrhea.  Musculoskeletal: Negative for generalized body aches. Skin: Negative for rash. Neurological: Negative for headaches, focal weakness or numbness.  ____________________________________________   PHYSICAL EXAM:  VITAL SIGNS: ED Triage Vitals [01/24/16  1335]  Enc Vitals Group     BP 138/86     Pulse Rate 64     Resp 18     Temp 98.3 F (36.8 C)     Temp Source Oral     SpO2 98 %     Weight 195 lb (88.5 kg)     Height 5\' 10"  (1.778 m)     Head Circumference      Peak Flow      Pain Score 8     Pain Loc      Pain Edu?      Excl. in GC?    Constitutional: Alert and oriented. Well appearing and in no acute distress. Eyes: Conjunctivae are normal. PERRL. EOMI. Head: Atraumatic. Nose: No congestion/rhinnorhea. Mouth/Throat: Mucous membranes are moist.  Oropharynx erythematous, with bilateral tonsillar exudate. Uvula is midline. Neck: No stridor.  Lymphatic: Lymphadenopathy: none noted. Cardiovascular: Normal rate, regular rhythm. Good peripheral circulation. Respiratory: Normal respiratory effort. Lungs CTAB. Gastrointestinal: Soft and nontender. Neurologic:  Normal speech and language. No gross focal neurologic deficits are appreciated. Speech is normal. No gait instability. Skin:  Skin is warm, dry and intact. No rash noted Psychiatric: Mood and affect are normal. Speech and behavior are normal.  ____________________________________________   LABS (all labs ordered are listed, but only abnormal results are displayed)  Labs Reviewed  POCT RAPID STREP A   ____________________________________________  EKG   ____________________________________________  RADIOLOGY   ____________________________________________   PROCEDURES  Procedure(s) performed: None  Critical Care performed: No  ____________________________________________   INITIAL IMPRESSION / ASSESSMENT AND PLAN / ED COURSE  Clinical Course    Pertinent labs & imaging results that were available during my care of the patient were reviewed by me and considered in my medical decision making (see chart for details).  Patient prescribed Amoxicillin and advised to take tylenol/ibuprofen for pain. He was encouraged to follow up with his PCP for symptoms  that are not improving over the weekend. He was encouraged to return to the ER for symptoms that change or worsen if unable to schedule an appointment. ____________________________________________   FINAL CLINICAL IMPRESSION(S) / ED DIAGNOSES  Final diagnoses:  None    Note:  This document was prepared using Dragon voice recognition software and may include unintentional dictation errors.    Chinita Pester, FNP 01/24/16 1728    Governor Rooks, MD 01/26/16 1017

## 2016-01-25 NOTE — ED Provider Notes (Signed)
Patient called to report he is experiencing hives after starting the amoxicillin. He was advised to take Benadryl 25mg , stop the Amoxicillin, and come to the emergency department if he begins to have any shortness of breath or swelling of the face or tongue.  Azithromycin Rx. Was called in to [facility] in [place].   Chinita PesterCari B Jakori Burkett, FNP 01/25/16 1842    Loleta Roseory Forbach, MD 01/25/16 2253

## 2016-05-18 ENCOUNTER — Emergency Department
Admission: EM | Admit: 2016-05-18 | Discharge: 2016-05-18 | Disposition: A | Discharge status: Home / Self Care | Payer: Medicaid Other | Attending: Emergency Medicine | Admitting: Emergency Medicine

## 2016-05-18 ENCOUNTER — Emergency Department: Payer: Medicaid Other

## 2016-05-18 ENCOUNTER — Encounter: Payer: Self-pay | Admitting: *Deleted

## 2016-05-18 DIAGNOSIS — Z79899 Other long term (current) drug therapy: Secondary | ICD-10-CM | POA: Diagnosis not present

## 2016-05-18 DIAGNOSIS — J40 Bronchitis, not specified as acute or chronic: Secondary | ICD-10-CM | POA: Diagnosis not present

## 2016-05-18 DIAGNOSIS — R05 Cough: Secondary | ICD-10-CM | POA: Diagnosis present

## 2016-05-18 MED ORDER — LEVOFLOXACIN 750 MG PO TABS: 750.0000 mg | ORAL_TABLET | Freq: Every day | ORAL | 0 refills | Status: AC

## 2016-05-18 NOTE — ED Triage Notes (Signed)
States cough, congestion and right ear pain for 3 weeks, awake and alert in no acucte distress

## 2016-05-18 NOTE — ED Provider Notes (Signed)
[facility] Emergency Department Provider Note   ____________________________________________    I have reviewed the triage vital signs and the nursing notes.   HISTORY  Chief Complaint Cough; Nasal Congestion; and Otalgia     HPI Seth Brown is a 56 y.o. male who presents with complaints of cough for approximately 3 weeks. Recently he reports he also lost his voice and now complains of mild ear discomfort and sinus congestion. He continues to have a nonproductive cough. He does not smoke. No shortness of breath. No recent travel. No calf pain or swelling.   History reviewed. No pertinent past medical history.  There are no active problems to display for this patient.   Past Surgical History:  Procedure Laterality Date  . BACK SURGERY      Prior to Admission medications   Medication Sig Start Date End Date Taking? Authorizing Provider  amoxicillin (AMOXIL) 500 MG tablet Take 1 tablet (500 mg total) by mouth 2 (two) times daily. 01/24/16   Chinita Pester, FNP  levofloxacin (LEVAQUIN) 750 MG tablet Take 1 tablet (750 mg total) by mouth daily. 05/18/16 05/25/16  Jene Every, MD  meloxicam (MOBIC) 15 MG tablet Take 1 tablet (15 mg total) by mouth daily. 12/30/14   Chinita Pester, FNP  naproxen (NAPROSYN) 500 MG tablet Take 1 tablet (500 mg total) by mouth 2 (two) times daily with a meal. 12/25/15   Jami L Hagler, PA-C  naproxen sodium (ANAPROX) 220 MG tablet Take 220 mg by mouth 3 (three) times daily with meals.    Historical Provider, MD     Allergies Amoxicillin  History reviewed. No pertinent family history.  Social History Social History  Substance Use Topics  . Smoking status: Never Smoker  . Smokeless tobacco: Never Used  . Alcohol use No    Review of Systems  Constitutional: No fever/chills  ENT: No sore throat.   Gastrointestinal: No abdominal pain.  No nausea, no vomiting.    Musculoskeletal: Negative  forMyalgias Skin: Negative for rash. Neurological: Negative for headaches     ____________________________________________   PHYSICAL EXAM:  VITAL SIGNS: ED Triage Vitals [05/18/16 1051]  Enc Vitals Group     BP 126/84     Pulse Rate 95     Resp 18     Temp 98.3 F (36.8 C)     Temp Source Oral     SpO2 96 %     Weight 205 lb (93 kg)     Height 5\' 10"  (1.778 m)     Head Circumference      Peak Flow      Pain Score      Pain Loc      Pain Edu?      Excl. in GC?     Constitutional: Alert and oriented. No acute distress. Pleasant and interactive Eyes: Conjunctivae are normal.  Ears: Normal tympanic membranes Nose: No congestion/rhinnorhea. Pharynx normal Mouth/Throat: Mucous membranes are moist.   Cardiovascular: Normal rate, regular rhythm.  Respiratory: Normal respiratory effort.  No retractions. Clear to auscultation bilaterally Genitourinary: deferred Musculoskeletal: No lower extremity tenderness nor edema.   Neurologic:  Normal speech and language. No gross focal neurologic deficits are appreciated.   Skin:  Skin is warm, dry and intact. No rash noted.   ____________________________________________   LABS (all labs ordered are listed, but only abnormal results are displayed)  Labs Reviewed - No data to display ____________________________________________  EKG   ____________________________________________  RADIOLOGY  Chest x-ray with atelectasis versus pneumonia ____________________________________________   PROCEDURES  Procedure(s) performed: No    Critical Care performed: No ____________________________________________   INITIAL IMPRESSION / ASSESSMENT AND PLAN / ED COURSE  Pertinent labs & imaging results that were available during my care of the patient were reviewed by me and considered in my medical decision making (see chart for details).  Patient presents with cough 3 weeks, x-ray as somewhat suspicious for pneumonia. Otherwise  he is quite well-appearing with normal vitals .We will start him on Levaquin and have him follow-up with his PCP. Return precautions discussed   ____________________________________________   FINAL CLINICAL IMPRESSION(S) / ED DIAGNOSES  Final diagnoses:  Bronchitis      NEW MEDICATIONS STARTED DURING THIS VISIT:  Discharge Medication List as of 05/18/2016 12:30 PM    START taking these medications   Details  levofloxacin (LEVAQUIN) 750 MG tablet Take 1 tablet (750 mg total) by mouth daily., Starting Mon 05/18/2016, Until Mon 05/25/2016, Print         Note:  This document was prepared using Dragon voice recognition software and may include unintentional dictation errors.    Jene Everyobert Brittanni Cariker, MD 05/18/16 1640

## 2016-05-18 NOTE — ED Notes (Signed)
Ambulatory to x ray

## 2016-06-06 ENCOUNTER — Emergency Department: Payer: Medicaid Other

## 2016-06-06 ENCOUNTER — Encounter: Payer: Self-pay | Admitting: Emergency Medicine

## 2016-06-06 ENCOUNTER — Emergency Department
Admission: EM | Admit: 2016-06-06 | Discharge: 2016-06-06 | Disposition: A | Discharge status: Home / Self Care | Payer: Medicaid Other | Attending: Emergency Medicine | Admitting: Emergency Medicine

## 2016-06-06 DIAGNOSIS — J029 Acute pharyngitis, unspecified: Secondary | ICD-10-CM | POA: Diagnosis present

## 2016-06-06 DIAGNOSIS — I10 Essential (primary) hypertension: Secondary | ICD-10-CM | POA: Diagnosis not present

## 2016-06-06 DIAGNOSIS — Z79899 Other long term (current) drug therapy: Secondary | ICD-10-CM | POA: Diagnosis not present

## 2016-06-06 DIAGNOSIS — J40 Bronchitis, not specified as acute or chronic: Secondary | ICD-10-CM | POA: Insufficient documentation

## 2016-06-06 HISTORY — DX: Essential (primary) hypertension: I10

## 2016-06-06 MED ORDER — IPRATROPIUM-ALBUTEROL 0.5-2.5 (3) MG/3ML IN SOLN
RESPIRATORY_TRACT | Status: AC
  Filled 2016-06-06: qty 3

## 2016-06-06 MED ORDER — ALBUTEROL SULFATE HFA 108 (90 BASE) MCG/ACT IN AERS: 2.0000 | INHALATION_SPRAY | Freq: Four times a day (QID) | RESPIRATORY_TRACT | 0 refills | Status: DC | PRN

## 2016-06-06 MED ORDER — IPRATROPIUM-ALBUTEROL 0.5-2.5 (3) MG/3ML IN SOLN
3.0000 mL | Freq: Once | RESPIRATORY_TRACT | Status: AC
  Administered 2016-06-06: 3 mL via RESPIRATORY_TRACT

## 2016-06-06 MED ORDER — PREDNISONE 10 MG (21) PO TBPK: ORAL_TABLET | ORAL | 0 refills | Status: DC

## 2016-06-06 NOTE — ED Notes (Signed)
Pt stating that he had been dx with bronchitis about 2 weeks ago. Pt stating that he was recovering good but then the other day he was stuck out in the cold rain. Pt stating that his sx became worse at that point. Pt is tight in the chest and feel like the bronchitis is back. Pt in NAD at this time and presents with a dry cough.

## 2016-06-06 NOTE — ED Triage Notes (Signed)
Sore throat x 4 to 5 days.

## 2016-06-06 NOTE — ED Notes (Signed)
Patient transported to X-ray 

## 2016-06-06 NOTE — ED Notes (Signed)
Pt returned from xray and given breathing tx.

## 2016-06-06 NOTE — ED Provider Notes (Signed)
[facility] Emergency Department Provider Note  ____________________________________________  Time seen: Approximately 12:25 PM  I have reviewed the triage vital signs and the nursing notes.   HISTORY  Chief Complaint Sore Throat    HPI Seth Brown is a 56 y.o. male that presents to the emergency department with cough and sore throat for several weeks. Patient states that he was diagnosed with bronchitis a couple weeks ago and started to get better. He states that he was out in the cold rain, which got it flared up again. Patient states that he occasionally feels like he is wheezing. He is not coughing anything up. Patient denies fever, shortness of breath, chest pain, nausea, vomiting, abdominal pain, diarrhea, constipation.   Past Medical History:  Diagnosis Date  . Hypertension     There are no active problems to display for this patient.   Past Surgical History:  Procedure Laterality Date  . BACK SURGERY      Prior to Admission medications   Medication Sig Start Date End Date Taking? Authorizing Provider  albuterol (PROVENTIL HFA;VENTOLIN HFA) 108 (90 Base) MCG/ACT inhaler Inhale 2 puffs into the lungs every 6 (six) hours as needed for wheezing or shortness of breath. 06/06/16   Enid DerryAshley Alonza Knisley, PA-C  amoxicillin (AMOXIL) 500 MG tablet Take 1 tablet (500 mg total) by mouth 2 (two) times daily. 01/24/16   Chinita Pesterari B Triplett, FNP  meloxicam (MOBIC) 15 MG tablet Take 1 tablet (15 mg total) by mouth daily. 12/30/14   Chinita Pesterari B Triplett, FNP  naproxen (NAPROSYN) 500 MG tablet Take 1 tablet (500 mg total) by mouth 2 (two) times daily with a meal. 12/25/15   Jami L Hagler, PA-C  naproxen sodium (ANAPROX) 220 MG tablet Take 220 mg by mouth 3 (three) times daily with meals.    Historical Provider, MD  predniSONE (STERAPRED UNI-PAK 21 TAB) 10 MG (21) TBPK tablet Take 6 tablets on day 1, take 5 tablets on day 2, take 4 tablets on day 3, take 3 tablets on day 4, take  2 tablets on day 5, take 1 tablet on day 6 06/06/16   Enid DerryAshley Orvile Corona, PA-C    Allergies Amoxicillin  No family history on file.  Social History Social History  Substance Use Topics  . Smoking status: Never Smoker  . Smokeless tobacco: Never Used  . Alcohol use No     Review of Systems  Constitutional: No fever/chills Eyes: No visual changes. No discharge. ENT: Negative for congestion and rhinorrhea. Cardiovascular: No chest pain. Respiratory: Positive for cough. No SOB. Gastrointestinal: No abdominal pain.  No nausea, no vomiting.  No diarrhea.  No constipation. Musculoskeletal: Negative for musculoskeletal pain. Skin: Negative for rash, abrasions, lacerations, ecchymosis. Neurological: Negative for headaches.   ____________________________________________   PHYSICAL EXAM:  VITAL SIGNS: ED Triage Vitals  Enc Vitals Group     BP 06/06/16 1147 (!) 149/100     Pulse Rate 06/06/16 1147 74     Resp 06/06/16 1147 18     Temp 06/06/16 1147 97.7 F (36.5 C)     Temp Source 06/06/16 1147 Oral     SpO2 06/06/16 1147 97 %     Weight 06/06/16 1148 205 lb (93 kg)     Height 06/06/16 1148 5\' 10"  (1.778 m)     Head Circumference --      Peak Flow --      Pain Score 06/06/16 1148 7     Pain Loc --  Pain Edu? --      Excl. in GC? --      Constitutional: Alert and oriented. Well appearing and in no acute distress. Eyes: Conjunctivae are normal. PERRL. EOMI. No discharge. Head: Atraumatic. ENT: No frontal and maxillary sinus tenderness.      Ears: Tympanic membranes pearly gray with good landmarks. No discharge.      Nose: No congestion/rhinnorhea.      Mouth/Throat: Mucous membranes are moist. Oropharynx non-erythematous. Tonsils not enlarged. No exudates. Uvula midline. Neck: No stridor.   Hematological/Lymphatic/Immunilogical: No cervical lymphadenopathy. Cardiovascular: Normal rate, regular rhythm.  Good peripheral circulation. Respiratory: Normal respiratory effort  without tachypnea or retractions. Lungs CTAB. Good air entry to the bases with no decreased or absent breath sounds. Gastrointestinal: Bowel sounds 4 quadrants. Soft and nontender to palpation. No guarding or rigidity. No palpable masses. No distention. Musculoskeletal: Full range of motion to all extremities. No gross deformities appreciated. Neurologic:  Normal speech and language. No gross focal neurologic deficits are appreciated.  Skin:  Skin is warm, dry and intact. No rash noted.   ____________________________________________   LABS (all labs ordered are listed, but only abnormal results are displayed)  Labs Reviewed - No data to display ____________________________________________  EKG   ____________________________________________  RADIOLOGY Lexine Baton, personally viewed and evaluated these images (plain radiographs) as part of my medical decision making, as well as reviewing the written report by the radiologist.  Dg Chest 2 View  Result Date: 06/06/2016 CLINICAL DATA:  Coughing for 3 weeks. EXAM: CHEST  2 VIEW COMPARISON:  05/18/2016 FINDINGS: Both lungs are clear. Heart and mediastinum are within normal limits. Trachea is midline. No acute bone abnormality. No pleural effusions. IMPRESSION: No active cardiopulmonary disease. Electronically Signed   By: Richarda Overlie M.D.   On: 06/06/2016 12:25    ____________________________________________    PROCEDURES  Procedure(s) performed:    Procedures    Medications  ipratropium-albuterol (DUONEB) 0.5-2.5 (3) MG/3ML nebulizer solution 3 mL (3 mLs Nebulization Given 06/06/16 1209)     ____________________________________________   INITIAL IMPRESSION / ASSESSMENT AND PLAN / ED COURSE  Pertinent labs & imaging results that were available during my care of the patient were reviewed by me and considered in my medical decision making (see chart for details).  Review of the [place] CSRS was performed in accordance of the  [facility] prior to dispensing any controlled drugs.     Patient's diagnosis is consistent with bronchitis. Vital signs and exam are reassuring. No acute cardiopulmonary processes indicated on chest x-ray. Patient felt better after DuoNeb treatment. Patient feels comfortable going home. Education was provided Loc questions were answered. Patient is excited to go to the gym and work out today. Patient states that he works out a lot and feels good enough to do so. Patient will be discharged home with prescriptions for prednisone and albuterol inhaler. Patient is to follow up with PCP as needed or otherwise directed. Patient is given ED precautions to return to the ED for any worsening or new symptoms.     ____________________________________________  FINAL CLINICAL IMPRESSION(S) / ED DIAGNOSES  Final diagnoses:  Bronchitis      NEW MEDICATIONS STARTED DURING THIS VISIT:  Discharge Medication List as of 06/06/2016  1:25 PM    START taking these medications   Details  albuterol (PROVENTIL HFA;VENTOLIN HFA) 108 (90 Base) MCG/ACT inhaler Inhale 2 puffs into the lungs every 6 (six) hours as needed for wheezing or shortness of breath., Starting Sat 06/06/2016,  Print    predniSONE (STERAPRED UNI-PAK 21 TAB) 10 MG (21) TBPK tablet Take 6 tablets on day 1, take 5 tablets on day 2, take 4 tablets on day 3, take 3 tablets on day 4, take 2 tablets on day 5, take 1 tablet on day 6, Print            This chart was dictated using voice recognition software/Dragon. Despite best efforts to proofread, errors can occur which can change the meaning. Any change was purely unintentional.    Enid Derry, PA-C 06/06/16 1552    Jeanmarie Plant, MD 06/07/16 [phone]

## 2017-05-17 ENCOUNTER — Emergency Department
Admission: EM | Admit: 2017-05-17 | Discharge: 2017-05-17 | Disposition: A | Discharge status: Home / Self Care | Payer: Medicaid Other | Attending: Emergency Medicine | Admitting: Emergency Medicine

## 2017-05-17 ENCOUNTER — Encounter: Payer: Self-pay | Admitting: Emergency Medicine

## 2017-05-17 DIAGNOSIS — Z79899 Other long term (current) drug therapy: Secondary | ICD-10-CM | POA: Diagnosis not present

## 2017-05-17 DIAGNOSIS — I10 Essential (primary) hypertension: Secondary | ICD-10-CM | POA: Diagnosis not present

## 2017-05-17 DIAGNOSIS — J0110 Acute frontal sinusitis, unspecified: Secondary | ICD-10-CM

## 2017-05-17 DIAGNOSIS — R0981 Nasal congestion: Secondary | ICD-10-CM | POA: Diagnosis present

## 2017-05-17 MED ORDER — DOXYCYCLINE HYCLATE 50 MG PO CAPS: 100.0000 mg | ORAL_CAPSULE | Freq: Two times a day (BID) | ORAL | 0 refills | Status: AC

## 2017-05-17 NOTE — ED Provider Notes (Signed)
[facility] Emergency Department Provider Note  ____________________________________________  Time seen: Approximately 4:37 PM  I have reviewed the triage vital signs and the nursing notes.   HISTORY  Chief Complaint Sore Throat and Generalized Body Aches    HPI Seth Brown is a [age] y.o. male that presents emergency department for evaluation of head congestion, nasal congestion, sore throat for 2 weeks.  No sick contacts.  No fever, chills, cough, shortness breath, chest pain, nausea, vomiting, abdominal pain.   Past Medical History:  Diagnosis Date  . Hypertension     There are no active problems to display for this patient.   Past Surgical History:  Procedure Laterality Date  . BACK SURGERY      Prior to Admission medications   Medication Sig Start Date End Date Taking? Authorizing Provider  albuterol (PROVENTIL HFA;VENTOLIN HFA) 108 (90 Base) MCG/ACT inhaler Inhale 2 puffs into the lungs every 6 (six) hours as needed for wheezing or shortness of breath. 06/06/16   Enid DerryWagner, Charis Juliana, PA-C  amoxicillin (AMOXIL) 500 MG tablet Take 1 tablet (500 mg total) by mouth 2 (two) times daily. 01/24/16   Triplett, Rulon Eisenmengerari B, FNP  doxycycline (VIBRAMYCIN) 50 MG capsule Take 2 capsules (100 mg total) by mouth 2 (two) times daily for 10 days. 05/17/17 05/27/17  Enid DerryWagner, Stevin Bielinski, PA-C  meloxicam (MOBIC) 15 MG tablet Take 1 tablet (15 mg total) by mouth daily. 12/30/14   Triplett, Rulon Eisenmengerari B, FNP  naproxen (NAPROSYN) 500 MG tablet Take 1 tablet (500 mg total) by mouth 2 (two) times daily with a meal. 12/25/15   Hagler, Jami L, PA-C  naproxen sodium (ANAPROX) 220 MG tablet Take 220 mg by mouth 3 (three) times daily with meals.    [provider]  predniSONE (STERAPRED UNI-PAK 21 TAB) 10 MG (21) TBPK tablet Take 6 tablets on day 1, take 5 tablets on day 2, take 4 tablets on day 3, take 3 tablets on day 4, take 2 tablets on day 5, take 1 tablet on day 6 06/06/16   Enid DerryWagner,  Jahna Liebert, PA-C    Allergies Amoxicillin  No family history on file.  Social History Social History   Tobacco Use  . Smoking status: Never Smoker  . Smokeless tobacco: Never Used  Substance Use Topics  . Alcohol use: No  . Drug use: No     Review of Systems  Constitutional: No fever/chills ENT: Positive for congestion. Cardiovascular: No chest pain. Respiratory: No SOB. Gastrointestinal: No abdominal pain.  No nausea, no vomiting.  Musculoskeletal: Negative for musculoskeletal pain. Skin: Negative for rash, abrasions, lacerations, ecchymosis. Neurological: Negative for headaches   ____________________________________________   PHYSICAL EXAM:  VITAL SIGNS: ED Triage Vitals  Enc Vitals Group     BP 05/17/17 1602 (!) 162/99     Pulse Rate 05/17/17 1602 63     Resp 05/17/17 1602 20     Temp 05/17/17 1602 98.4 F (36.9 C)     Temp Source 05/17/17 1602 Oral     SpO2 05/17/17 1602 98 %     Weight 05/17/17 1603 195 lb (88.5 kg)     Height 05/17/17 1603 5\' 10"  (1.778 m)     Head Circumference --      Peak Flow --      Pain Score 05/17/17 1609 7     Pain Loc --      Pain Edu? --      Excl. in GC? --      Constitutional:  Alert and oriented. Well appearing and in no acute distress. Eyes: Conjunctivae are normal. PERRL. EOMI. Head: Atraumatic. ENT: Frontal sinus tenderness.      Ears:      Nose: No congestion/rhinnorhea.      Mouth/Throat: Mucous membranes are moist.  Neck: No stridor.   Cardiovascular: Normal rate, regular rhythm.  Good peripheral circulation. Respiratory: Normal respiratory effort without tachypnea or retractions. Lungs CTAB. Good air entry to the bases with no decreased or absent breath sounds. Gastrointestinal: Bowel sounds 4 quadrants. Soft and nontender to palpation. No guarding or rigidity. No palpable masses. No distention.  Musculoskeletal: Full range of motion to all extremities. No gross deformities appreciated. Neurologic:  Normal  speech and language. No gross focal neurologic deficits are appreciated.  Skin:  Skin is warm, dry and intact. No rash noted.   ____________________________________________   LABS (all labs ordered are listed, but only abnormal results are displayed)  Labs Reviewed - No data to display ____________________________________________  EKG   ____________________________________________  RADIOLOGY   No results found.  ____________________________________________    PROCEDURES  Procedure(s) performed:    Procedures    Medications - No data to display   ____________________________________________   INITIAL IMPRESSION / ASSESSMENT AND PLAN / ED COURSE  Pertinent labs & imaging results that were available during my care of the patient were reviewed by me and considered in my medical decision making (see chart for details).  Review of the [place] CSRS was performed in accordance of the [facility] prior to dispensing any controlled drugs.   Patient's diagnosis is consistent with sinus infection.  Vital signs and exam are reassuring.  Patient will be discharged home with prescriptions for Augmentin. Patient is to follow up with PCP as directed. Patient is given ED precautions to return to the ED for any worsening or new symptoms.     ____________________________________________  FINAL CLINICAL IMPRESSION(S) / ED DIAGNOSES  Final diagnoses:  Acute non-recurrent frontal sinusitis      NEW MEDICATIONS STARTED DURING THIS VISIT:  ED Discharge Orders        Ordered    doxycycline (VIBRAMYCIN) 50 MG capsule  2 times daily     05/17/17 1641          This chart was dictated using voice recognition software/Dragon. Despite best efforts to proofread, errors can occur which can change the meaning. Any change was purely unintentional.    Enid Derry, PA-C 05/17/17 1739    Dionne Bucy, MD 05/17/17 2212

## 2017-05-17 NOTE — ED Triage Notes (Addendum)
States he developed body aches   Cough and sore throat about 2 weeks ago  Has tried OTC meds w/o relief   States cough is non prod

## 2017-06-07 ENCOUNTER — Encounter: Payer: Self-pay | Admitting: Emergency Medicine

## 2017-06-07 ENCOUNTER — Emergency Department
Admission: EM | Admit: 2017-06-07 | Discharge: 2017-06-07 | Disposition: A | Discharge status: Home / Self Care | Payer: Medicaid Other | Attending: Emergency Medicine | Admitting: Emergency Medicine

## 2017-06-07 ENCOUNTER — Other Ambulatory Visit: Payer: Self-pay

## 2017-06-07 DIAGNOSIS — I10 Essential (primary) hypertension: Secondary | ICD-10-CM | POA: Insufficient documentation

## 2017-06-07 DIAGNOSIS — M546 Pain in thoracic spine: Secondary | ICD-10-CM | POA: Insufficient documentation

## 2017-06-07 DIAGNOSIS — M549 Dorsalgia, unspecified: Secondary | ICD-10-CM | POA: Diagnosis present

## 2017-06-07 LAB — URINALYSIS, COMPLETE (UACMP) WITH MICROSCOPIC
BILIRUBIN URINE: NEGATIVE
Bacteria, UA: NONE SEEN
Glucose, UA: NEGATIVE mg/dL
HGB URINE DIPSTICK: NEGATIVE
Ketones, ur: NEGATIVE mg/dL
Leukocytes, UA: NEGATIVE
Nitrite: NEGATIVE
PH: 6 (ref 5.0–8.0)
Protein, ur: NEGATIVE mg/dL
SPECIFIC GRAVITY, URINE: 1.028 (ref 1.005–1.030)
Squamous Epithelial / LPF: NONE SEEN

## 2017-06-07 NOTE — Discharge Instructions (Addendum)
Continue regular medication as prescribed by your doctor in [place].  Follow-up with Dr. Tollie EthPlummer if you continue to have problems with your back. Have your blood pressure rechecked by your primary care doctor at [facility].

## 2017-06-07 NOTE — ED Triage Notes (Signed)
Increased back pain x2-3 days , goes to pain management , chronic pain at 8/10 ,

## 2017-06-07 NOTE — ED Provider Notes (Signed)
[facility] Emergency Department Provider Note  ____________________________________________   First MD Initiated Contact with Patient 06/07/17 [phone]     (approximate)  I have reviewed the triage vital signs and the nursing notes.   HISTORY  Chief Complaint Back Pain  HPI Seth Brown is a 57 y.o. male is here with complaint of right sided back pain.  Patient denies any recent injury.  He is unsure of any urinary symptoms and denies any history of kidney stones.  He is currently seeing the pain clinic in [place].  He states that he was seen on February 25.  He takes Percocet for his back pain but has not taken anything in the last 2 days.  He denies any incontinence of bowel or bladder.  He continues to walk without assistance.  Past Medical History:  Diagnosis Date  . Hypertension     There are no active problems to display for this patient.   Past Surgical History:  Procedure Laterality Date  . BACK SURGERY      Prior to Admission medications   Medication Sig Start Date End Date Taking? Authorizing Provider  albuterol (PROVENTIL HFA;VENTOLIN HFA) 108 (90 Base) MCG/ACT inhaler Inhale 2 puffs into the lungs every 6 (six) hours as needed for wheezing or shortness of breath. 06/06/16   Enid DerryWagner, Ashley, PA-C    Allergies Amoxicillin  No family history on file.  Social History Social History   Tobacco Use  . Smoking status: Never Smoker  . Smokeless tobacco: Never Used  Substance Use Topics  . Alcohol use: No  . Drug use: No    Review of Systems Constitutional: No fever/chills Eyes: No visual changes. ENT: No sore throat. Cardiovascular: Denies chest pain. Respiratory: Denies shortness of breath. Gastrointestinal: No abdominal pain.  No nausea, no vomiting.   Genitourinary: Negative for dysuria. Musculoskeletal: Positive for back pain. Skin: Negative for rash. Neurological: Negative for headaches, focal weakness or  numbness. ____________________________________________   PHYSICAL EXAM:  VITAL SIGNS: ED Triage Vitals  Enc Vitals Group     BP 06/07/17 0723 (!) 173/96     Pulse Rate 06/07/17 0722 76     Resp 06/07/17 0722 20     Temp 06/07/17 0722 98.3 F (36.8 C)     Temp Source 06/07/17 0722 Oral     SpO2 06/07/17 0722 97 %     Weight 06/07/17 0723 195 lb (88.5 kg)     Height 06/07/17 0723 5\' 10"  (1.778 m)     Head Circumference --      Peak Flow --      Pain Score 06/07/17 0723 0     Pain Loc --      Pain Edu? --      Excl. in GC? --    Constitutional: Alert and oriented. Well appearing and in no acute distress. Eyes: Conjunctivae are normal.  Head: Atraumatic. Neck: No stridor.   Cardiovascular: Normal rate, regular rhythm. Grossly normal heart sounds.  Good peripheral circulation. Respiratory: Normal respiratory effort.  No retractions. Lungs CTAB. Gastrointestinal: Soft and nontender. No distention. No CVA tenderness. Musculoskeletal: Examination of the back there is a well-healed surgical scar that is nontender to palpation.  There is some right thoracic soft tissue tenderness with decreased range of motion secondary to increased pain.  Patient is able to walk without any assistance.  Straight leg raises were negative.  Good muscle strength bilaterally. Neurologic:  Normal speech and language. No gross focal neurologic deficits are  appreciated.  Reflexes 1+ bilaterally. Skin:  Skin is warm, dry and intact. No rash noted. Psychiatric: Mood and affect are normal. Speech and behavior are normal.  ____________________________________________   LABS (all labs ordered are listed, but only abnormal results are displayed)  Labs Reviewed  URINALYSIS, COMPLETE (UACMP) WITH MICROSCOPIC - Abnormal; Notable for the following components:      Result Value   Color, Urine YELLOW (*)    APPearance CLEAR (*)    All other components within normal limits    PROCEDURES  Procedure(s)  performed: None  Procedures  Critical Care performed: No  ____________________________________________   INITIAL IMPRESSION / ASSESSMENT AND PLAN / ED COURSE  As part of my medical decision making, I reviewed the following data within the electronic MEDICAL RECORD NUMBER Notes from prior ED visits and [place] Controlled Substance Database  Patient is currently under the care of a pain management doctor in [place] and last prescription for oxycodone was written on 05/31/17.  Patient was reassured that urinalysis was negative and that there was findings suggestive of kidney stone.  Patient will continue taking his pain medication as prescribed by his doctor.  He is encouraged to use ice or heat to his back as needed and to contact his pain management doctor if any continued problems.  ____________________________________________   FINAL CLINICAL IMPRESSION(S) / ED DIAGNOSES  Final diagnoses:  Right-sided thoracic back pain, unspecified chronicity     ED Discharge Orders    None       Note:  This document was prepared using Dragon voice recognition software and may include unintentional dictation errors.    Tommi Rumps, PA-C 06/07/17 1529    Jeanmarie Plant, MD 06/07/17 [phone]

## 2017-06-08 ENCOUNTER — Emergency Department
Admission: EM | Admit: 2017-06-08 | Discharge: 2017-06-08 | Disposition: A | Discharge status: Home / Self Care | Payer: Medicaid Other | Attending: Emergency Medicine | Admitting: Emergency Medicine

## 2017-06-08 ENCOUNTER — Other Ambulatory Visit: Payer: Self-pay

## 2017-06-08 ENCOUNTER — Encounter: Payer: Self-pay | Admitting: Emergency Medicine

## 2017-06-08 DIAGNOSIS — M545 Low back pain: Secondary | ICD-10-CM | POA: Diagnosis present

## 2017-06-08 DIAGNOSIS — Z5321 Procedure and treatment not carried out due to patient leaving prior to being seen by health care provider: Secondary | ICD-10-CM | POA: Insufficient documentation

## 2017-06-08 NOTE — ED Triage Notes (Signed)
Patient ambulatory to triage with steady gait, without difficulty or distress noted; pt reports lower back pain; seen earlier but pain persists; pt st he goes to a pain clinic and ran out of his medicine

## 2017-06-08 NOTE — ED Notes (Signed)
After inquiring about wait time; pt left ED lobby with SO, upset over wait time

## 2017-09-22 IMAGING — CR DG SHOULDER 2+V*R*
3 series · 3 of 3 positions shown · non-contrast
Comparison: None.

CLINICAL DATA: Patient injured his right shoulder while weight
lifting last [REDACTED]. Pain is not improving.

EXAM:
RIGHT SHOULDER - 2+ VIEW

[shoulder grashey]
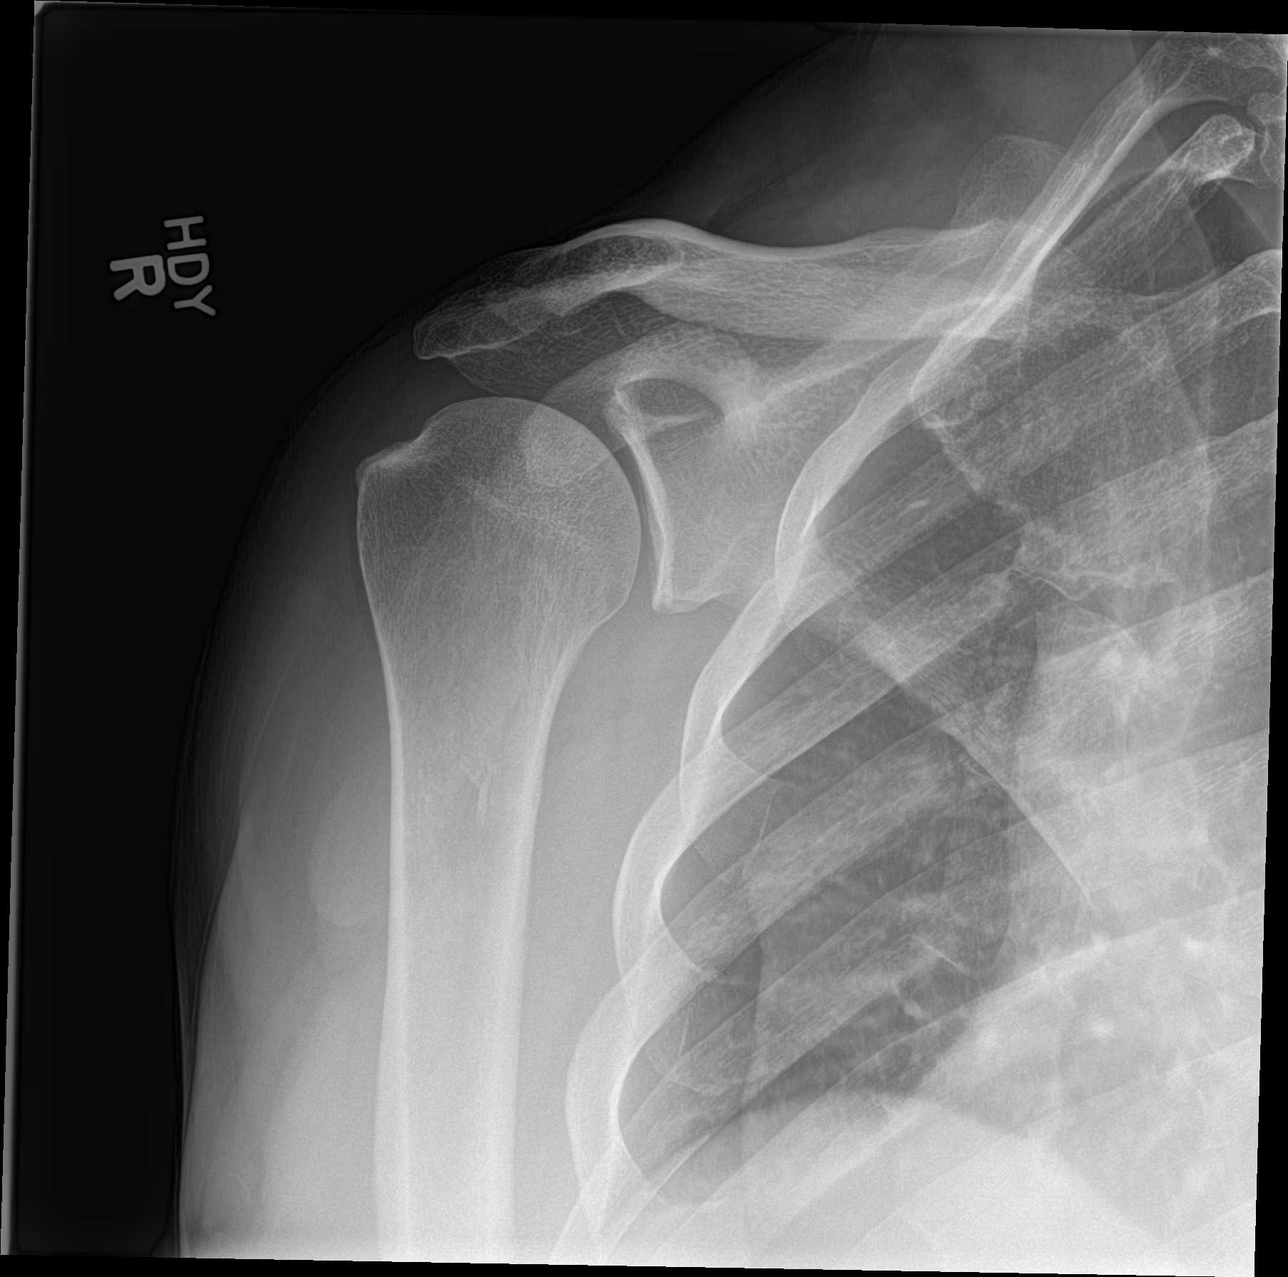

[shoulder y view]
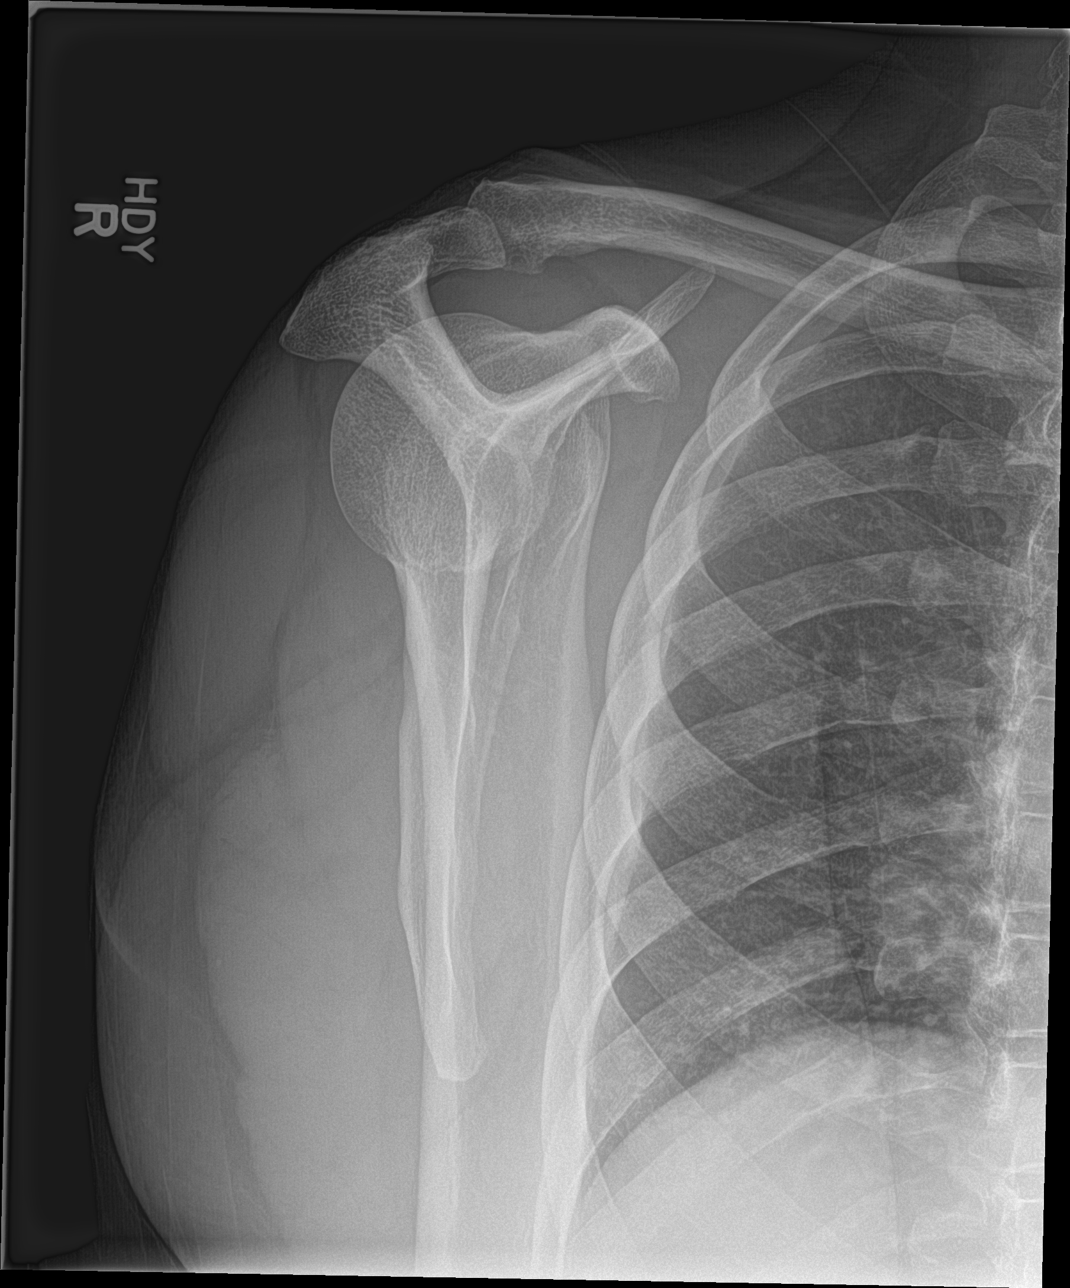

[shoulder axillary]
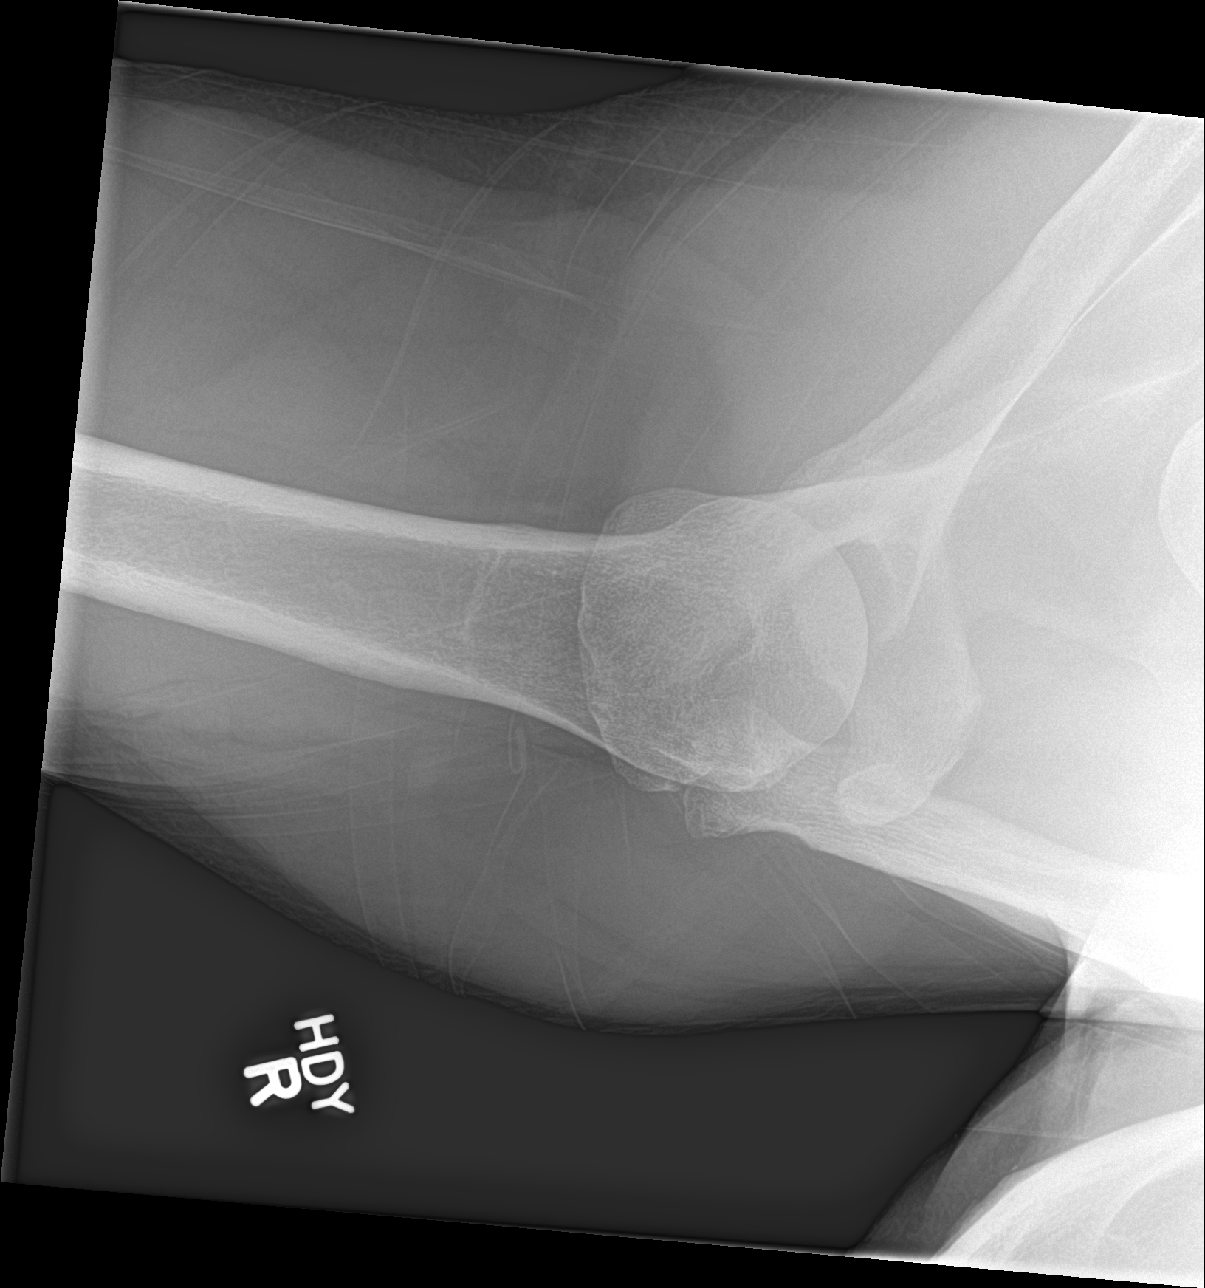

[3 of 3 positions shown; findings below may reference images not displayed]

FINDINGS: There is no evidence of fracture or dislocation. There is no
evidence of arthropathy or other focal bone abnormality. Soft
tissues are unremarkable.
IMPRESSION: Negative.

## 2017-12-07 ENCOUNTER — Emergency Department: Payer: Medicaid Other

## 2017-12-07 ENCOUNTER — Other Ambulatory Visit: Payer: Self-pay

## 2017-12-07 ENCOUNTER — Emergency Department
Admission: EM | Admit: 2017-12-07 | Discharge: 2017-12-07 | Disposition: A | Discharge status: Home / Self Care | Payer: Medicaid Other | Attending: Emergency Medicine | Admitting: Emergency Medicine

## 2017-12-07 DIAGNOSIS — Z79899 Other long term (current) drug therapy: Secondary | ICD-10-CM | POA: Insufficient documentation

## 2017-12-07 DIAGNOSIS — R079 Chest pain, unspecified: Secondary | ICD-10-CM | POA: Insufficient documentation

## 2017-12-07 DIAGNOSIS — I10 Essential (primary) hypertension: Secondary | ICD-10-CM | POA: Diagnosis not present

## 2017-12-07 LAB — CBC
HCT: 39.2 % — ABNORMAL LOW (ref 40.0–52.0)
HEMOGLOBIN: 13.3 g/dL (ref 13.0–18.0)
MCH: 28.3 pg (ref 26.0–34.0)
MCHC: 33.8 g/dL (ref 32.0–36.0)
MCV: 83.7 fL (ref 80.0–100.0)
PLATELETS: 209 10*3/uL (ref 150–440)
RBC: 4.69 MIL/uL (ref 4.40–5.90)
RDW: 14.8 % — ABNORMAL HIGH (ref 11.5–14.5)
WBC: 5.5 10*3/uL (ref 3.8–10.6)

## 2017-12-07 LAB — BASIC METABOLIC PANEL
ANION GAP: 5 (ref 5–15)
BUN: 13 mg/dL (ref 6–20)
CALCIUM: 8.7 mg/dL — AB (ref 8.9–10.3)
CO2: 26 mmol/L (ref 22–32)
CREATININE: 1.04 mg/dL (ref 0.61–1.24)
Chloride: 106 mmol/L (ref 98–111)
Glucose, Bld: 126 mg/dL — ABNORMAL HIGH (ref 70–99)
Potassium: 4 mmol/L (ref 3.5–5.1)
Sodium: 137 mmol/L (ref 135–145)

## 2017-12-07 LAB — TROPONIN I

## 2017-12-07 NOTE — Discharge Instructions (Addendum)
Please call the number provided for cardiology today to arrange the next available appointment for evaluation and consideration of possible stress test.  Return to the emergency department for any further significant chest discomfort, any shortness of breath nausea or sweatiness.  Otherwise please also follow-up with your primary care doctor to advise them of today's ER visit and results.

## 2017-12-07 NOTE — ED Provider Notes (Signed)
[facility] Emergency Department Provider Note  Time seen: 11:40 AM  I have reviewed the triage vital signs and the nursing notes.   HISTORY  Chief Complaint Chest Pain    HPI Seth Brown is a 57 y.o. male with a past medical history of hypertension who presents to the emergency department for chest pain.  According to the patient for the past several weeks he has been experiencing mild chest discomfort only while mowing his lawn.  States when he stops mowing the lawn the pain goes away.  He states he walks quite a bit for exercise and is never experienced shortness of breath or chest pain while walking.  States it is only with pushing a lawnmower that he experiences the chest pain.  Patient went to his primary care Dr. Theodoro Grist for evaluation and they sent him to the emergency department.  Here the patient appears well denies any symptoms at this time.  States he has not had any chest discomfort since mowing his lawn on Saturday.  Denies any leg pain or swelling.  Denies any shortness of breath at any point.  Denies any nausea.   Past Medical History:  Diagnosis Date  . Hypertension     There are no active problems to display for this patient.   Past Surgical History:  Procedure Laterality Date  . BACK SURGERY      Prior to Admission medications   Medication Sig Start Date End Date Taking? Authorizing Provider  albuterol (PROVENTIL HFA;VENTOLIN HFA) 108 (90 Base) MCG/ACT inhaler Inhale 2 puffs into the lungs every 6 (six) hours as needed for wheezing or shortness of breath. 06/06/16   Enid Derry, PA-C    Allergies  Allergen Reactions  . Amoxicillin Hives    No family history on file.  Social History Social History   Tobacco Use  . Smoking status: Never Smoker  . Smokeless tobacco: Never Used  Substance Use Topics  . Alcohol use: No  . Drug use: No    Review of Systems Constitutional: Negative for fever. Cardiovascular: Chest pain 3  days ago none since. Respiratory: Negative for shortness of breath. Gastrointestinal: Negative for abdominal pain, vomiting  Genitourinary: Negative for urinary compaints Musculoskeletal: Negative for leg pain or swelling Skin: Negative for skin complaints  Neurological: Negative for headache All other ROS negative  ____________________________________________   PHYSICAL EXAM:  VITAL SIGNS: ED Triage Vitals  Enc Vitals Group     BP 12/07/17 1004 (!) 144/98     Pulse Rate 12/07/17 1004 60     Resp 12/07/17 1004 18     Temp 12/07/17 1004 98.2 F (36.8 C)     Temp Source 12/07/17 1004 Oral     SpO2 12/07/17 1004 98 %     Weight 12/07/17 1005 195 lb (88.5 kg)     Height 12/07/17 1005 5\' 10"  (1.778 m)     Head Circumference --      Peak Flow --      Pain Score --      Pain Loc --      Pain Edu? --      Excl. in GC? --     Constitutional: Alert and oriented. Well appearing and in no distress. Eyes: Normal exam ENT   Head: Normocephalic and atraumatic.   Mouth/Throat: Mucous membranes are moist. Cardiovascular: Normal rate, regular rhythm. No murmur Respiratory: Normal respiratory effort without tachypnea nor retractions. Breath sounds are clear  Gastrointestinal: Soft and nontender. No distention.  Musculoskeletal: Nontender with normal range of motion in all extremities. No lower extremity tenderness or edema. Neurologic:  Normal speech and language. No gross focal neurologic deficits Skin:  Skin is warm, dry and intact.  Psychiatric: Mood and affect are normal.   ____________________________________________    EKG  EKG reviewed and interpreted by myself shows normal sinus rhythm at 66 bpm, narrow QRS, normal axis, normal intervals, no ST changes.  Normal EKG.  ____________________________________________    RADIOLOGY  Chest x-ray is normal  ____________________________________________   INITIAL IMPRESSION / ASSESSMENT AND PLAN / ED  COURSE  Pertinent labs & imaging results that were available during my care of the patient were reviewed by me and considered in my medical decision making (see chart for details).  Patient presents to the emergency department for chest discomfort only while mowing his lawn.  States it has been occurring over the past 2 to 3 weeks, last occurred 3 days ago while mowing his lawn.  States it is only with pushing his lawn more that he experiences pain.  Denies any pain when walking or doing other exertional activities.  Denies any symptoms at all currently.  Differential this time would include chest wall pain, ACS, angina, stable angina.  Patient's labs are resulted largely within normal limits including a negative troponin.  Chest x-ray is clear.  EKG is normal.  Patient has a nontender chest wall to palpation.  I strongly suspect stable angina versus chest wall discomfort.  Given normal enzymes with no symptoms I believe the patient is safe for discharge home.  I did discuss with the patient the importance of following up with a cardiologist for a stress test in the very near future.  Patient is agreeable this plan of care.  I also discussed my normal chest pain return precautions.  ____________________________________________   FINAL CLINICAL IMPRESSION(S) / ED DIAGNOSES  Chest pain    Minna Antis, MD 12/07/17 1143

## 2017-12-07 NOTE — ED Triage Notes (Signed)
Pt was sent by PCP for further eval of chest pain that he has been having since Saturday with SOB, states he noticed it while mowing on Saturday, states he kept getting out of breathe. Pt is in NAD on arrival, pt ambulatory to triage without difficulty. Skin is warm and dry,. Respirations WNL.

## 2017-12-07 NOTE — ED Notes (Signed)
MD aware of pt BP, and confirms orders for discharge with current BP. Pt unable to sign e-signature pad for discharge due to pad not working in room at this time. Pt signed paper copy to be placed in pt chart

## 2018-04-21 ENCOUNTER — Ambulatory Visit
Admission: EM | Admit: 2018-04-21 | Discharge: 2018-04-21 | Disposition: A | Discharge status: Home / Self Care | Payer: Medicaid Other | Attending: Family Medicine | Admitting: Family Medicine

## 2018-04-21 ENCOUNTER — Other Ambulatory Visit: Payer: Self-pay

## 2018-04-21 DIAGNOSIS — B9789 Other viral agents as the cause of diseases classified elsewhere: Secondary | ICD-10-CM | POA: Diagnosis not present

## 2018-04-21 DIAGNOSIS — J069 Acute upper respiratory infection, unspecified: Secondary | ICD-10-CM | POA: Insufficient documentation

## 2018-04-21 DIAGNOSIS — R05 Cough: Secondary | ICD-10-CM | POA: Diagnosis not present

## 2018-04-21 LAB — RAPID INFLUENZA A&B ANTIGENS (ARMC ONLY): INFLUENZA B (ARMC): NEGATIVE

## 2018-04-21 LAB — RAPID INFLUENZA A&B ANTIGENS: Influenza A (ARMC): NEGATIVE

## 2018-04-21 MED ORDER — BENZONATATE 100 MG PO CAPS: 100.0000 mg | ORAL_CAPSULE | Freq: Three times a day (TID) | ORAL | 0 refills | Status: DC | PRN

## 2018-04-21 MED ORDER — IPRATROPIUM BROMIDE 0.06 % NA SOLN: 2.0000 | Freq: Four times a day (QID) | NASAL | 0 refills | Status: DC | PRN

## 2018-04-21 NOTE — ED Triage Notes (Addendum)
Pt with nasal congestion and now has body aches. Endorses cough and chills.

## 2018-04-21 NOTE — Discharge Instructions (Signed)
Rest. ° °Fluids. ° °Medications as prescribed. ° °Take care ° °Dr. Lynnea Vandervoort  °

## 2018-04-21 NOTE — ED Provider Notes (Signed)
[facility]    CSN: [id number] Arrival date & time: 04/21/18  1005   History   Chief Complaint Chief Complaint  Patient presents with  . Generalized Body Aches  . Nasal Congestion   HPI  58 year old male presents for evaluation of the above.  Patient states that he has been sick for the past 4 days.Marland Kitchen  He reports congestion, cough, body aches, chills.  No documented fever.  Also reports diarrhea.  No known exacerbating or relieving factors.  Reports sick contacts at work.  No other associated symptoms.  No other complaints.  History reviewed as below. Past Medical History:  Diagnosis Date  . Hypertension    Past Surgical History:  Procedure Laterality Date  . BACK SURGERY      Home Medications    Prior to Admission medications   Medication Sig Start Date End Date Taking? Authorizing Provider  benzonatate (TESSALON) 100 MG capsule Take 1 capsule (100 mg total) by mouth 3 (three) times daily as needed. 04/21/18   Everlene Other G, DO  ipratropium (ATROVENT) 0.06 % nasal spray Place 2 sprays into both nostrils 4 (four) times daily as needed for rhinitis. 04/21/18   Tommie Sams, DO   Social History Social History   Tobacco Use  . Smoking status: Never Smoker  . Smokeless tobacco: Never Used  Substance Use Topics  . Alcohol use: No  . Drug use: No     Allergies   Amoxicillin   Review of Systems Review of Systems  Constitutional: Positive for chills.  HENT: Positive for congestion.   Respiratory: Positive for cough.   Musculoskeletal:       Body aches.    Physical Exam Triage Vital Signs ED Triage Vitals  Enc Vitals Group     BP 04/21/18 1010 (!) 152/102     Pulse Rate 04/21/18 1010 60     Resp 04/21/18 1010 16     Temp 04/21/18 1010 98 F (36.7 C)     Temp Source 04/21/18 1010 Oral     SpO2 04/21/18 1010 100 %     Weight 04/21/18 1012 195 lb (88.5 kg)     Height 04/21/18 1012 5\' 10"  (1.778 m)     Head Circumference --      Peak Flow --      Pain Score 04/21/18 1012 10     Pain Loc --      Pain Edu? --      Excl. in GC? --    Updated Vital Signs BP (!) 152/102 (BP Location: Left Arm)   Pulse 60   Temp 98 F (36.7 C) (Oral)   Resp 16   Ht 5\' 10"  (1.778 m)   Wt 88.5 kg   SpO2 100%   BMI 27.98 kg/m   Visual Acuity Right Eye Distance:   Left Eye Distance:   Bilateral Distance:    Right Eye Near:   Left Eye Near:    Bilateral Near:     Physical Exam Vitals signs and nursing note reviewed.  Constitutional:      General: He is not in acute distress. HENT:     Head: Normocephalic and atraumatic.     Right Ear: Tympanic membrane normal.     Left Ear: Tympanic membrane normal.     Mouth/Throat:     Pharynx: Oropharynx is clear.  Eyes:     General:        Right eye: No discharge.  Left eye: No discharge.     Conjunctiva/sclera: Conjunctivae normal.  Cardiovascular:     Rate and Rhythm: Normal rate and regular rhythm.  Pulmonary:     Effort: Pulmonary effort is normal.     Breath sounds: No wheezing, rhonchi or rales.  Neurological:     Mental Status: He is alert.  Psychiatric:        Mood and Affect: Mood normal.        Behavior: Behavior normal.    UC Treatments / Results  Labs (all labs ordered are listed, but only abnormal results are displayed) Labs Reviewed  RAPID INFLUENZA A&B ANTIGENS ([facility] ONLY)    EKG None  Radiology No results found.  Procedures Procedures (including critical care time)  Medications Ordered in UC Medications - No data to display  Initial Impression / Assessment and Plan / UC Course  I have reviewed the triage vital signs and the nursing notes.  Pertinent labs & imaging results that were available during my care of the patient were reviewed by me and considered in my medical decision making (see chart for details).    58 year old male presents with viral URI with cough. Treating with tessalon perles and atrovent nasal spray.  Final Clinical  Impressions(s) / UC Diagnoses   Final diagnoses:  Viral URI with cough     Discharge Instructions     Rest. Fluids.  Medications as prescribed.  Take care  Dr. Adriana Simas    ED Prescriptions    Medication Sig Dispense Auth. Provider   benzonatate (TESSALON) 100 MG capsule Take 1 capsule (100 mg total) by mouth 3 (three) times daily as needed. 30 capsule Rithwik Schmieg G, DO   ipratropium (ATROVENT) 0.06 % nasal spray Place 2 sprays into both nostrils 4 (four) times daily as needed for rhinitis. 15 mL Tommie Sams, DO     Controlled Substance Prescriptions [place] Controlled Substance Registry consulted? Not Applicable   Tommie Sams, DO 04/21/18 1147

## 2018-05-01 ENCOUNTER — Encounter: Payer: Self-pay | Admitting: Gynecology

## 2018-05-01 ENCOUNTER — Other Ambulatory Visit: Payer: Self-pay

## 2018-05-01 ENCOUNTER — Ambulatory Visit
Admission: EM | Admit: 2018-05-01 | Discharge: 2018-05-01 | Disposition: A | Discharge status: Home / Self Care | Payer: Medicaid Other | Attending: Family Medicine | Admitting: Family Medicine

## 2018-05-01 DIAGNOSIS — R05 Cough: Secondary | ICD-10-CM | POA: Insufficient documentation

## 2018-05-01 DIAGNOSIS — R059 Cough, unspecified: Secondary | ICD-10-CM

## 2018-05-01 MED ORDER — AZITHROMYCIN 250 MG PO TABS: 250.0000 mg | ORAL_TABLET | Freq: Every day | ORAL | 0 refills | Status: DC

## 2018-05-01 MED ORDER — GUAIFENESIN-CODEINE 100-10 MG/5ML PO SYRP
5.0000 mL | ORAL_SOLUTION | Freq: Three times a day (TID) | ORAL | 0 refills | Status: DC | PRN
Start: 2018-05-01 — End: 2018-10-12

## 2018-05-01 NOTE — ED Provider Notes (Signed)
[facility]    CSN: [id number] Arrival date & time: 05/01/18  1548     History   Chief Complaint No chief complaint on file.   HPI Seth Brown is a 58 y.o. male.   HPI  58 year old male returns today after being seen on 04/21/2018 where he was diagnosed with a viral URI.  He was placed on on Perles and spray.  This is not any better in fact the coughing is worse particular at nighttime.  Has no fever or chills.        Past Medical History:  Diagnosis Date  . Hypertension     There are no active problems to display for this patient.   Past Surgical History:  Procedure Laterality Date  . BACK SURGERY         Home Medications    Prior to Admission medications   Medication Sig Start Date End Date Taking? Authorizing Provider  ipratropium (ATROVENT) 0.06 % nasal spray Place 2 sprays into both nostrils 4 (four) times daily as needed for rhinitis. 04/21/18  Yes Cook, Jayce G, DO  azithromycin (ZITHROMAX) 250 MG tablet Take 1 tablet (250 mg total) by mouth daily. Take first 2 tablets together, then 1 every day until finished. 05/01/18   Lutricia Feil, PA-C  guaiFENesin-codeine (CHERATUSSIN AC) 100-10 MG/5ML syrup Take 5 mLs by mouth 3 (three) times daily as needed for cough. 05/01/18   Lutricia Feil, PA-C    Family History History reviewed. No pertinent family history.  Social History Social History   Tobacco Use  . Smoking status: Never Smoker  . Smokeless tobacco: Never Used  Substance Use Topics  . Alcohol use: No  . Drug use: No     Allergies   Amoxicillin   Review of Systems Review of Systems  Constitutional: Positive for activity change. Negative for chills, fatigue and fever.  HENT: Positive for congestion, postnasal drip and rhinorrhea.   Respiratory: Positive for cough.   All other systems reviewed and are negative.    Physical Exam Triage Vital Signs ED Triage Vitals  Enc Vitals Group     BP 05/01/18 1617 (!)  161/114     Pulse Rate 05/01/18 1617 70     Resp 05/01/18 1617 16     Temp 05/01/18 1617 98 F (36.7 C)     Temp Source 05/01/18 1617 Oral     SpO2 05/01/18 1617 100 %     Weight 05/01/18 1620 195 lb (88.5 kg)     Height --      Head Circumference --      Peak Flow --      Pain Score 05/01/18 1619 9     Pain Loc --      Pain Edu? --      Excl. in GC? --    No data found.  Updated Vital Signs BP (!) 161/114 (BP Location: Left Arm)   Pulse 70   Temp 98 F (36.7 C) (Oral)   Resp 16   Wt 195 lb (88.5 kg)   SpO2 100%   BMI 27.98 kg/m   Visual Acuity Right Eye Distance:   Left Eye Distance:   Bilateral Distance:    Right Eye Near:   Left Eye Near:    Bilateral Near:     Physical Exam Vitals signs and nursing note reviewed.  Constitutional:      General: He is not in acute distress.    Appearance: Normal appearance. He  is not ill-appearing, toxic-appearing or diaphoretic.  HENT:     Head: Normocephalic.     Right Ear: Tympanic membrane, ear canal and external ear normal.     Left Ear: Tympanic membrane, ear canal and external ear normal.     Nose: Congestion present.     Mouth/Throat:     Mouth: Mucous membranes are moist.     Pharynx: No oropharyngeal exudate or posterior oropharyngeal erythema.  Eyes:     General:        Right eye: No discharge.     Conjunctiva/sclera: Conjunctivae normal.  Neck:     Musculoskeletal: Normal range of motion.  Pulmonary:     Effort: Pulmonary effort is normal.     Breath sounds: Normal breath sounds.  Musculoskeletal: Normal range of motion.  Lymphadenopathy:     Cervical: No cervical adenopathy.  Skin:    General: Skin is warm and dry.  Neurological:     General: No focal deficit present.     Mental Status: He is alert and oriented to person, place, and time.  Psychiatric:        Mood and Affect: Mood normal.        Behavior: Behavior normal.        Thought Content: Thought content normal.        Judgment: Judgment  normal.      UC Treatments / Results  Labs (all labs ordered are listed, but only abnormal results are displayed) Labs Reviewed - No data to display  EKG None  Radiology No results found.  Procedures Procedures (including critical care time)  Medications Ordered in UC Medications - No data to display  Initial Impression / Assessment and Plan / UC Course  I have reviewed the triage vital signs and the nursing notes.  Pertinent labs & imaging results that were available during my care of the patient were reviewed by me and considered in my medical decision making (see chart for details).   Patient presents with continued coughing.  Prescribe T Cheratussin for nighttime use.  Will prescribe a Z-Pak in case there is an atypical reason for his coughing.  Advised him that the Z-Pak does not work then this is likely viral and will have to run its course.  Was satisfied with this.   Final Clinical Impressions(s) / UC Diagnoses   Final diagnoses:  Cough     Discharge Instructions     Drink plenty of fluids.  Rest as much as possible.  Use Tylenol or Motrin for fever chills or body aches.    ED Prescriptions    Medication Sig Dispense Auth. Provider   azithromycin (ZITHROMAX) 250 MG tablet Take 1 tablet (250 mg total) by mouth daily. Take first 2 tablets together, then 1 every day until finished. 6 tablet Lutricia Feil, PA-C   guaiFENesin-codeine (CHERATUSSIN AC) 100-10 MG/5ML syrup Take 5 mLs by mouth 3 (three) times daily as needed for cough. 120 mL Lutricia Feil, PA-C     Controlled Substance Prescriptions [place] Controlled Substance Registry consulted? Not Applicable   Lutricia Feil, PA-C 05/01/18 1734

## 2018-05-01 NOTE — ED Triage Notes (Signed)
Patient c/o was seen x 1 week ago. Per patient coughing not better. Pt. Stated still with nasal congestion

## 2018-05-01 NOTE — Discharge Instructions (Addendum)
Drink plenty of fluids.  Rest as much as possible.  Use Tylenol or Motrin for fever chills or body aches.  °

## 2018-05-17 ENCOUNTER — Other Ambulatory Visit: Payer: Self-pay | Admitting: Family Medicine

## 2018-08-04 ENCOUNTER — Ambulatory Visit
Admission: RE | Admit: 2018-08-04 | Discharge: 2018-08-04 | Disposition: A | Discharge status: Home / Self Care | Payer: Medicaid Other | Attending: Specialist | Admitting: Specialist

## 2018-08-04 ENCOUNTER — Other Ambulatory Visit: Payer: Self-pay

## 2018-08-04 ENCOUNTER — Ambulatory Visit
Admission: RE | Admit: 2018-08-04 | Discharge: 2018-08-04 | Disposition: A | Discharge status: Home / Self Care | Payer: Medicaid Other | Source: Ambulatory Visit | Attending: Specialist | Admitting: Specialist

## 2018-08-04 ENCOUNTER — Other Ambulatory Visit: Payer: Self-pay | Admitting: Specialist

## 2018-08-04 DIAGNOSIS — M545 Low back pain, unspecified: Secondary | ICD-10-CM

## 2018-10-12 ENCOUNTER — Other Ambulatory Visit: Payer: Self-pay

## 2018-10-12 ENCOUNTER — Observation Stay
Admission: EM | Admit: 2018-10-12 | Discharge: 2018-10-13 | Disposition: A | Discharge status: Home / Self Care | Payer: Medicaid Other | Attending: Internal Medicine | Admitting: Internal Medicine

## 2018-10-12 DIAGNOSIS — Z1159 Encounter for screening for other viral diseases: Secondary | ICD-10-CM | POA: Diagnosis not present

## 2018-10-12 DIAGNOSIS — G8929 Other chronic pain: Secondary | ICD-10-CM | POA: Diagnosis not present

## 2018-10-12 DIAGNOSIS — H409 Unspecified glaucoma: Secondary | ICD-10-CM | POA: Insufficient documentation

## 2018-10-12 DIAGNOSIS — Z79891 Long term (current) use of opiate analgesic: Secondary | ICD-10-CM | POA: Insufficient documentation

## 2018-10-12 DIAGNOSIS — M549 Dorsalgia, unspecified: Secondary | ICD-10-CM | POA: Insufficient documentation

## 2018-10-12 DIAGNOSIS — K921 Melena: Principal | ICD-10-CM | POA: Insufficient documentation

## 2018-10-12 DIAGNOSIS — I10 Essential (primary) hypertension: Secondary | ICD-10-CM | POA: Diagnosis not present

## 2018-10-12 LAB — CBC
HCT: 40.8 % (ref 39.0–52.0)
Hemoglobin: 13.8 g/dL (ref 13.0–17.0)
MCH: 28.1 pg (ref 26.0–34.0)
MCHC: 33.8 g/dL (ref 30.0–36.0)
MCV: 83.1 fL (ref 80.0–100.0)
Platelets: 249 10*3/uL (ref 150–400)
RBC: 4.91 MIL/uL (ref 4.22–5.81)
RDW: 13.3 % (ref 11.5–15.5)
WBC: 6.5 10*3/uL (ref 4.0–10.5)
nRBC: 0 % (ref 0.0–0.2)

## 2018-10-12 LAB — COMPREHENSIVE METABOLIC PANEL
ALT: 27 U/L (ref 0–44)
AST: 33 U/L (ref 15–41)
Albumin: 4.6 g/dL (ref 3.5–5.0)
Alkaline Phosphatase: 49 U/L (ref 38–126)
Anion gap: 9 (ref 5–15)
BUN: 12 mg/dL (ref 6–20)
CO2: 23 mmol/L (ref 22–32)
Calcium: 9.2 mg/dL (ref 8.9–10.3)
Chloride: 109 mmol/L (ref 98–111)
Creatinine, Ser: 0.93 mg/dL (ref 0.61–1.24)
GFR calc Af Amer: >60 mL/min (ref >60)
GFR calc non Af Amer: >60 mL/min (ref >60)
Glucose, Bld: 125 mg/dL — ABNORMAL HIGH (ref 70–99)
Potassium: 4 mmol/L (ref 3.5–5.1)
Sodium: 141 mmol/L (ref 135–145)
Total Bilirubin: 0.4 mg/dL (ref 0.3–1.2)
Total Protein: 7.9 g/dL (ref 6.5–8.1)

## 2018-10-12 LAB — TYPE AND SCREEN
ABO/RH(D): O NEG
Antibody Screen: NEGATIVE

## 2018-10-12 LAB — ABO/RH: ABO/RH(D): O NEG

## 2018-10-12 LAB — SARS CORONAVIRUS 2 BY RT PCR (HOSPITAL ORDER, PERFORMED IN ~~LOC~~ HOSPITAL LAB): SARS Coronavirus 2: NEGATIVE

## 2018-10-12 MED ORDER — ONDANSETRON HCL 4 MG PO TABS: 4.0000 mg | ORAL_TABLET | Freq: Four times a day (QID) | ORAL | Status: DC | PRN

## 2018-10-12 MED ORDER — AMLODIPINE BESYLATE 5 MG PO TABS
5.0000 mg | ORAL_TABLET | Freq: Every day | ORAL | Status: DC
  Administered 2018-10-12: 5 mg via ORAL
  Filled 2018-10-12: qty 1

## 2018-10-12 MED ORDER — ACETAMINOPHEN 325 MG PO TABS: 650.0000 mg | ORAL_TABLET | Freq: Four times a day (QID) | ORAL | Status: DC | PRN

## 2018-10-12 MED ORDER — AMLODIPINE BESYLATE 5 MG PO TABS
5.0000 mg | ORAL_TABLET | Freq: Once | ORAL | Status: AC
  Administered 2018-10-12: 5 mg via ORAL
  Filled 2018-10-12: qty 1

## 2018-10-12 MED ORDER — OXYCODONE HCL ER 15 MG PO T12A
15.0000 mg | EXTENDED_RELEASE_TABLET | Freq: Two times a day (BID) | ORAL | Status: DC
  Administered 2018-10-12 – 2018-10-13 (×2): 15 mg via ORAL
  Filled 2018-10-12 (×2): qty 1

## 2018-10-12 MED ORDER — ONDANSETRON HCL 4 MG/2ML IJ SOLN: 4.0000 mg | Freq: Four times a day (QID) | INTRAMUSCULAR | Status: DC | PRN

## 2018-10-12 MED ORDER — LISINOPRIL 10 MG PO TABS
10.0000 mg | ORAL_TABLET | Freq: Every day | ORAL | Status: DC
  Administered 2018-10-12: 10 mg via ORAL
  Filled 2018-10-12 (×2): qty 1

## 2018-10-12 MED ORDER — OXYCODONE-ACETAMINOPHEN 5-325 MG PO TABS
1.0000 | ORAL_TABLET | Freq: Four times a day (QID) | ORAL | Status: DC | PRN
  Administered 2018-10-12: 1 via ORAL
  Filled 2018-10-12: qty 1

## 2018-10-12 MED ORDER — IPRATROPIUM BROMIDE 0.06 % NA SOLN: 2.0000 | Freq: Four times a day (QID) | NASAL | Status: DC | PRN

## 2018-10-12 MED ORDER — PANTOPRAZOLE SODIUM 40 MG IV SOLR
40.0000 mg | Freq: Two times a day (BID) | INTRAVENOUS | Status: DC
  Administered 2018-10-12 – 2018-10-13 (×3): 40 mg via INTRAVENOUS
  Filled 2018-10-12 (×3): qty 40

## 2018-10-12 MED ORDER — BRIMONIDINE TARTRATE 0.2 % OP SOLN
1.0000 [drp] | Freq: Three times a day (TID) | OPHTHALMIC | Status: DC
  Administered 2018-10-12 – 2018-10-13 (×2): 1 [drp] via OPHTHALMIC
  Filled 2018-10-12: qty 5

## 2018-10-12 MED ORDER — ACETAMINOPHEN 650 MG RE SUPP: 650.0000 mg | Freq: Four times a day (QID) | RECTAL | Status: DC | PRN

## 2018-10-12 MED ORDER — AMLODIPINE BESYLATE 10 MG PO TABS
10.0000 mg | ORAL_TABLET | Freq: Every day | ORAL | Status: DC
  Administered 2018-10-13: 10 mg via ORAL
  Filled 2018-10-12: qty 1

## 2018-10-12 MED ORDER — PANTOPRAZOLE SODIUM 40 MG IV SOLR: 40.0000 mg | Freq: Once | INTRAVENOUS | Status: DC

## 2018-10-12 MED ORDER — SODIUM CHLORIDE 0.9% FLUSH
3.0000 mL | Freq: Two times a day (BID) | INTRAVENOUS | Status: DC
  Administered 2018-10-12 – 2018-10-13 (×2): 3 mL via INTRAVENOUS

## 2018-10-12 NOTE — H&P (Signed)
[facility] [place] NAME: Seth Brown    MR#:  [id number]  DATE OF BIRTH:  1960-05-04  DATE OF ADMISSION:  10/12/2018  PRIMARY CARE PHYSICIAN: [facility] [place]   REQUESTING/REFERRING PHYSICIAN: Dr Marjean Donna  CHIEF COMPLAINT:   Chief Complaint  Patient presents with  . GI Bleeding    HISTORY OF PRESENT ILLNESS:  Seth Brown  is a 58 y.o. male coming in with chronic back pain.  He presents with his stomach being messed up for a week or so.  He describes it as a discomfort but not a pain.  When I asked him how much on the pain scale he said it is not that bad.  He states he has been having black stools for about that time about 2-3 soft bowel movements.  No nausea or vomiting.  In the ER he was hemodynamically stable.  Guaiac positive black stool.  Hospitalist services contacted for further evaluation.  PAST MEDICAL HISTORY:   Past Medical History:  Diagnosis Date  . Hypertension     PAST SURGICAL HISTORY:   Past Surgical History:  Procedure Laterality Date  . BACK SURGERY      SOCIAL HISTORY:   Social History   Tobacco Use  . Smoking status: Never Smoker  . Smokeless tobacco: Never Used  Substance Use Topics  . Alcohol use: No    FAMILY HISTORY:   Family History  Problem Relation Age of Onset  . CAD Mother   . COPD Father     DRUG ALLERGIES:   Allergies  Allergen Reactions  . Amoxicillin Hives    REVIEW OF SYSTEMS:  CONSTITUTIONAL: No fever, fatigue or weakness.  EYES: No blurred or double vision.  Wears glasses. EARS, NOSE, AND THROAT: No tinnitus or ear pain. No sore throat RESPIRATORY: No cough, shortness of breath, wheezing or hemoptysis.  CARDIOVASCULAR: No chest pain, orthopnea, edema.  GASTROINTESTINAL: No nausea, vomiting, diarrhea.  Positive for abdominal pain.  Positive for melena GENITOURINARY: No dysuria, hematuria.  ENDOCRINE: No polyuria, nocturia,  HEMATOLOGY: No  anemia, easy bruising or bleeding SKIN: No rash or lesion. MUSCULOSKELETAL: Chronic back pain NEUROLOGIC: No tingling, numbness, weakness.  PSYCHIATRY: No anxiety or depression.   MEDICATIONS AT HOME:   Prior to Admission medications   Medication Sig Start Date End Date Taking? Authorizing Provider  ipratropium (ATROVENT) 0.06 % nasal spray Place 2 sprays into both nostrils 4 (four) times daily as needed for rhinitis. 04/21/18   Coral Spikes, DO      VITAL SIGNS:  Blood pressure (!) 163/103, pulse 88, temperature 98.6 F (37 C), temperature source Oral, resp. rate 17, height 5\' 10"  (1.778 m), weight 88.5 kg, SpO2 97 %.  PHYSICAL EXAMINATION:  GENERAL:  58 y.o.-year-old patient lying in the bed with no acute distress.  EYES: Pupils equal, round, reactive to light and accommodation. No scleral icterus. Extraocular muscles intact.  HEENT: Head atraumatic, normocephalic. Oropharynx and nasopharynx clear.  NECK:  Supple, no jugular venous distention. No thyroid enlargement, no tenderness.  LUNGS: Normal breath sounds bilaterally, no wheezing, rales,rhonchi or crepitation. No use of accessory muscles of respiration.  CARDIOVASCULAR: S1, S2 normal. No murmurs, rubs, or gallops.  ABDOMEN: Soft, slight epigastric discomfort. nondistended. Bowel sounds present. No organomegaly or mass.  EXTREMITIES: No pedal edema, cyanosis, or clubbing.  NEUROLOGIC: Cranial nerves II through XII are intact. Muscle strength 5/5 in all extremities. Sensation intact. Gait not checked.  PSYCHIATRIC: The patient  is alert and oriented x 3.  SKIN: No rash, lesion, or ulcer.   LABORATORY PANEL:   CBC Recent Labs  Lab 10/12/18 1335  WBC 6.5  HGB 13.8  HCT 40.8  PLT 249   ------------------------------------------------------------------------------------------------------------------  Chemistries  Recent Labs  Lab 10/12/18 1335  NA 141  K 4.0  CL 109  CO2 23  GLUCOSE 125*  BUN 12  CREATININE 0.93   CALCIUM 9.2  AST 33  ALT 27  ALKPHOS 49  BILITOT 0.4   ------------------------------------------------------------------------------------------------------------------   EKG:   Ordered by me  IMPRESSION AND PLAN:   1.  Upper GI bleed with melena.  Patient hemodynamically stable.  Not taking any medications that can irritate the stomach.  Will put on IV Protonix.  Consulted GI for endoscopy tomorrow.  Rapid COVID needed prior to procedure. 2.  Hypertension start low-dose Norvasc. 3.  Chronic back pain on Percocet 4.  Patient states he takes eyedrops for glaucoma.  Med rec needs to be reconciled again.  All the records are reviewed and case discussed with ED provider. Management plans discussed with the patient, family and they are in agreement.  CODE STATUS: Full code  TOTAL TIME TAKING CARE OF THIS PATIENT: 50 minutes.    Alford Highlandichard Joury Allcorn M.D on 10/12/2018 at 3:08 PM  Between 7am to 6pm - Pager - [phone]  After 6pm call admission pager [phone]  [employer] Office  [phone]  CC: Primary care physician; [facility]

## 2018-10-12 NOTE — Progress Notes (Signed)
Spoke to Dr. Leslye Peer (prime doc) about patient's continued high B.P. after administration of Norvasc and Lisinopril. See new order for additional Norvasc dose. No IV med d/t HR concern for now. Will administer second PO Norvasc dose now and pass along in shift report. Wenda Low [facility]

## 2018-10-12 NOTE — ED Triage Notes (Signed)
Pt c/o generalized abd pain with black stools for the past 2 weeks. Denies a hx of similar.

## 2018-10-12 NOTE — ED Provider Notes (Signed)
[facility] Emergency Department Provider Note  ____________________________________________   First MD Initiated Contact with Patient 10/12/18 1437     (approximate)  I have reviewed the triage vital signs and the nursing notes.   HISTORY  Chief Complaint GI Bleeding    HPI Seth Brown is a 58 y.o. male hypertension not any blood thinners who presents for dark stool.  Patient said with past week he felt like his abdomen was uncomfortable.  He then developed black stool last night.  He has had 2-3 episodes today of black stool.  Nothing has made this better, nothing has made this worse.  He denies ever having this previously.  Denies a history of NSAIDs.  Is only on Percocets for back pain.          Past Medical History:  Diagnosis Date  . Hypertension     There are no active problems to display for this patient.   Past Surgical History:  Procedure Laterality Date  . BACK SURGERY      Prior to Admission medications   Medication Sig Start Date End Date Taking? Authorizing Provider  azithromycin (ZITHROMAX) 250 MG tablet Take 1 tablet (250 mg total) by mouth daily. Take first 2 tablets together, then 1 every day until finished. 05/01/18   Lorin Picket, PA-C  guaiFENesin-codeine (CHERATUSSIN AC) 100-10 MG/5ML syrup Take 5 mLs by mouth 3 (three) times daily as needed for cough. 05/01/18   Lorin Picket, PA-C  ipratropium (ATROVENT) 0.06 % nasal spray Place 2 sprays into both nostrils 4 (four) times daily as needed for rhinitis. 04/21/18   Coral Spikes, DO    Allergies Amoxicillin  No family history on file.  Social History Social History   Tobacco Use  . Smoking status: Never Smoker  . Smokeless tobacco: Never Used  Substance Use Topics  . Alcohol use: No  . Drug use: No      Review of Systems Constitutional: No fever/chills Eyes: No visual changes. ENT: No sore throat. Cardiovascular: Denies chest pain.  Respiratory: Denies shortness of breath. Gastrointestinal: No abdominal pain.  No nausea, no vomiting.  No diarrhea.  No constipation.  Abdominal cramping.  Melena. Genitourinary: Negative for dysuria. Musculoskeletal: Negative for back pain. Skin: Negative for rash. Neurological: Negative for headaches, focal weakness or numbness. All other ROS negative ____________________________________________   PHYSICAL EXAM:  VITAL SIGNS: ED Triage Vitals [10/12/18 1331]  Enc Vitals Group     BP (!) 163/103     Pulse Rate 88     Resp 17     Temp 98.6 F (37 C)     Temp Source Oral     SpO2 97 %     Weight 195 lb (88.5 kg)     Height 5\' 10"  (1.778 m)     Head Circumference      Peak Flow      Pain Score 3     Pain Loc      Pain Edu?      Excl. in [place]?     Constitutional: Alert and oriented. Well appearing and in no acute distress. Eyes: Conjunctivae are normal. EOMI. Head: Atraumatic. Nose: No congestion/rhinnorhea. Mouth/Throat: Mucous membranes are moist.   Neck: No stridor. Trachea Midline. FROM Cardiovascular: Normal rate, regular rhythm. Grossly normal heart sounds.  Good peripheral circulation. Respiratory: Normal respiratory effort.  No retractions. Lungs CTAB. Gastrointestinal: Soft and nontender. No distention. No abdominal bruits.  Musculoskeletal: No lower extremity tenderness nor  edema.  No joint effusions. Neurologic:  Normal speech and language. No gross focal neurologic deficits are appreciated.  Skin:  Skin is warm, dry and intact. No rash noted. Psychiatric: Mood and affect are normal. Speech and behavior are normal. GU: Melena on exam, Hemoccult positive  ____________________________________________   LABS (all labs ordered are listed, but only abnormal results are displayed)  Labs Reviewed  COMPREHENSIVE METABOLIC PANEL - Abnormal; Notable for the following components:      Result Value   Glucose, Bld 125 (*)    All other components within normal limits   SARS CORONAVIRUS 2 (HOSPITAL ORDER, PERFORMED IN [facility])  CBC  HIV ANTIBODY (ROUTINE TESTING W REFLEX)  POC OCCULT BLOOD, ED  TYPE AND SCREEN  ABO/RH   ____________________________________________  _ ____________________________________________   PROCEDURES  Procedure(s) performed (including Critical Care):  Procedures   ____________________________________________   INITIAL IMPRESSION / ASSESSMENT AND PLAN / ED COURSE  Seth Brown was evaluated in Emergency Department on 10/12/2018 for the symptoms described in the history of present illness. He was evaluated in the context of the global COVID-19 pandemic, which necessitated consideration that the patient might be at risk for infection with the SARS-CoV-2 virus that causes COVID-19. Institutional protocols and algorithms that pertain to the evaluation of patients at risk for COVID-19 are in a state of rapid change based on information released by regulatory bodies including the CDC and federal and state organizations. These policies and algorithms were followed during the patient's care in the ED.    Patient presents with melena.  This concerning for upper GI bleed.  No risk factors to suggest ulcers or gastritis but will start on PPI.  No history of liver disease to suggest variceal bleeding.  Denies severe alcohol use.  Patient's abdomen is soft and nontender therefore unlikely to be acute abdominal pathology.   Hemoglobin is stable however given patient's age and melena on exam will discuss with hospital team for admission to trend hemoglobins.  COVID virus testing ordered in case patient ends up needing endoscopy       ____________________________________________   FINAL CLINICAL IMPRESSION(S) / ED DIAGNOSES   Final diagnoses:  Melena      MEDICATIONS GIVEN DURING THIS VISIT:  Medications  pantoprazole (PROTONIX) injection 40 mg (has no administration in time range)  amLODipine  (NORVASC) tablet 5 mg (has no administration in time range)  oxyCODONE-acetaminophen (PERCOCET/ROXICET) 5-325 MG per tablet 1 tablet (has no administration in time range)  oxyCODONE (OXYCONTIN) 12 hr tablet 15 mg (has no administration in time range)  brimonidine (ALPHAGAN) 0.2 % ophthalmic solution 1 drop (has no administration in time range)     ED Discharge Orders    None       Note:  This document was prepared using Dragon voice recognition software and may include unintentional dictation errors.   Concha SeFunke, Harlin Mazzoni E, MD 10/12/18 [phone]

## 2018-10-12 NOTE — ED Notes (Signed)
ED TO INPATIENT HANDOFF REPORT  ED Nurse Name and Phone #: Janett Billow [phone]  S Name/Age/Gender Seth Brown 58 y.o. male Room/Bed: ED25A/ED25A  Code Status   Code Status: Not on file  Home/SNF/Other Home Patient oriented to: self, place, time and situation Is this baseline? Yes   Triage Complete: Triage complete  Chief Complaint dark stool  Triage Note Pt c/o generalized abd pain with black stools for the past 2 weeks. Denies a hx of similar.   Allergies Allergies  Allergen Reactions  . Amoxicillin Hives    Level of Care/Admitting Diagnosis ED Disposition    ED Disposition Condition [place] [facility] Area: [place] [[id number]]  Level of Care: Med-Surg [16]  Covid Evaluation: Asymptomatic Screening Protocol (No Symptoms)  Diagnosis: Melena [546270]  Admitting Physician: Loletha Grayer [[id number]]  Attending Physician: Loletha Grayer [phone]  PT Class (Do Not Modify): Observation [104]  PT Acc Code (Do Not Modify): Observation [10022]       B Medical/Surgery History Past Medical History:  Diagnosis Date  . Hypertension    Past Surgical History:  Procedure Laterality Date  . BACK SURGERY       A IV Location/Drains/Wounds Patient Lines/Drains/Airways Status   Active Line/Drains/Airways    Name:   Placement date:   Placement time:   Site:   Days:   Peripheral IV 10/12/18 Left Wrist   10/12/18    1504    Wrist   less than 1          Intake/Output Last 24 hours No intake or output data in the 24 hours ending 10/12/18 1526  Labs/Imaging Results for orders placed or performed during the hospital encounter of 10/12/18 (from the past 48 hour(s))  Comprehensive metabolic panel     Status: Abnormal   Collection Time: 10/12/18  1:35 PM  Result Value Ref Range   Sodium 141 135 - 145 mmol/L   Potassium 4.0 3.5 - 5.1 mmol/L   Chloride 109 98 - 111 mmol/L   CO2 23 22 - 32 mmol/L   Glucose, Bld 125 (H) 70 - 99 mg/dL    BUN 12 6 - 20 mg/dL   Creatinine, Ser 0.93 0.61 - 1.24 mg/dL   Calcium 9.2 8.9 - 10.3 mg/dL   Total Protein 7.9 6.5 - 8.1 g/dL   Albumin 4.6 3.5 - 5.0 g/dL   AST 33 15 - 41 U/L   ALT 27 0 - 44 U/L   Alkaline Phosphatase 49 38 - 126 U/L   Total Bilirubin 0.4 0.3 - 1.2 mg/dL   GFR calc non Af Amer >60 >60 mL/min   GFR calc Af Amer >60 >60 mL/min   Anion gap 9 5 - 15    Comment: Performed at [facility], [address] [place], [place] [address]  CBC     Status: None   Collection Time: 10/12/18  1:35 PM  Result Value Ref Range   WBC 6.5 4.0 - 10.5 K/uL   RBC 4.91 4.22 - 5.81 MIL/uL   Hemoglobin 13.8 13.0 - 17.0 g/dL   HCT 40.8 39.0 - 52.0 %   MCV 83.1 80.0 - 100.0 fL   MCH 28.1 26.0 - 34.0 pg   MCHC 33.8 30.0 - 36.0 g/dL   RDW 13.3 11.5 - 15.5 %   Platelets 249 150 - 400 K/uL   nRBC 0.0 0.0 - 0.2 %    Comment: Performed at [facility], [address] [place],  [place] [employer] [id number]  Type and screen [facility]     Status: None   Collection Time: 10/12/18  1:35 PM  Result Value Ref Range   ABO/RH(D) O NEG    Antibody Screen NEG    Sample Expiration      10/15/2018,2359 Performed at [facility], [address] [place], [place] [id number]    No results found.  Pending Labs [employer] [facility] (From admission, onward)    Start     Ordered   10/12/18 1505  SARS Coronavirus 2 [facility](Hospital order, Performed in [facility])  (Novel Coronavirus, [facility](Hospital Order))  ONCE - STAT,   STAT     10/12/18 1504   10/12/18 1505  HIV antibody (Routine Testing)  Once,   STAT     10/12/18 1504   10/12/18 1505  ABO/Rh  Once,   STAT     10/12/18 1504          Vitals/Pain Today's Vitals   10/12/18 1331  BP: (!) 163/103  Pulse: 88  Resp: 17  Temp: 98.6 F (37 C)  TempSrc: Oral  SpO2: 97%  Weight: 88.5 kg  Height: 5\' 10"  (1.778 m)  PainSc: 3     Isolation Precautions Airborne and Contact  precautions  Medications Medications  pantoprazole (PROTONIX) injection 40 mg (has no administration in time range)  ipratropium (ATROVENT) 0.06 % nasal spray 2 spray (has no administration in time range)  amLODipine (NORVASC) tablet 5 mg (has no administration in time range)  oxyCODONE-acetaminophen (PERCOCET/ROXICET) 5-325 MG per tablet 1 tablet (has no administration in time range)    Mobility walks Low fall risk   Focused Assessments Cardiac Assessment Handoff:    Lab Results  Component Value Date   TROPONINI <0.03 12/07/2017   No results found for: DDIMER Does the Patient currently have chest pain? No     R Recommendations: See Admitting Provider Note  Report given to:   Additional Notes:

## 2018-10-12 NOTE — ED Notes (Signed)
Provider did occult test at bedside. Pt positive for blood. This RN was present.

## 2018-10-13 ENCOUNTER — Observation Stay: Payer: Medicaid Other | Admitting: Registered Nurse

## 2018-10-13 ENCOUNTER — Encounter
Admission: EM | Disposition: A | Discharge status: Home / Self Care | Payer: Self-pay | Source: Home / Self Care | Attending: Emergency Medicine

## 2018-10-13 ENCOUNTER — Encounter: Payer: Self-pay | Admitting: Emergency Medicine

## 2018-10-13 DIAGNOSIS — K921 Melena: Secondary | ICD-10-CM

## 2018-10-13 HISTORY — PX: ESOPHAGOGASTRODUODENOSCOPY (EGD) WITH PROPOFOL: SHX5813

## 2018-10-13 LAB — CBC
HCT: 40.8 % (ref 39.0–52.0)
Hemoglobin: 13.5 g/dL (ref 13.0–17.0)
MCH: 28 pg (ref 26.0–34.0)
MCHC: 33.1 g/dL (ref 30.0–36.0)
MCV: 84.6 fL (ref 80.0–100.0)
Platelets: 224 10*3/uL (ref 150–400)
RBC: 4.82 MIL/uL (ref 4.22–5.81)
RDW: 13.6 % (ref 11.5–15.5)
WBC: 6.6 10*3/uL (ref 4.0–10.5)
nRBC: 0 % (ref 0.0–0.2)

## 2018-10-13 LAB — BASIC METABOLIC PANEL
Anion gap: 7 (ref 5–15)
BUN: 12 mg/dL (ref 6–20)
CO2: 28 mmol/L (ref 22–32)
Calcium: 9 mg/dL (ref 8.9–10.3)
Chloride: 105 mmol/L (ref 98–111)
Creatinine, Ser: 1.15 mg/dL (ref 0.61–1.24)
GFR calc Af Amer: >60 mL/min (ref >60)
GFR calc non Af Amer: >60 mL/min (ref >60)
Glucose, Bld: 112 mg/dL — ABNORMAL HIGH (ref 70–99)
Potassium: 4.2 mmol/L (ref 3.5–5.1)
Sodium: 140 mmol/L (ref 135–145)

## 2018-10-13 LAB — GLUCOSE, CAPILLARY: Glucose-Capillary: 81 mg/dL (ref 70–99)

## 2018-10-13 SURGERY — ESOPHAGOGASTRODUODENOSCOPY (EGD) WITH PROPOFOL
Anesthesia: General

## 2018-10-13 MED ORDER — GLYCOPYRROLATE 0.2 MG/ML IJ SOLN
INTRAMUSCULAR | Status: AC
  Filled 2018-10-13: qty 1

## 2018-10-13 MED ORDER — SODIUM CHLORIDE 0.9 % IR SOLN
1000.0000 mL | Freq: Once | Status: AC
  Administered 2018-10-13: 1000 mL

## 2018-10-13 MED ORDER — FENTANYL CITRATE (PF) 100 MCG/2ML IJ SOLN
INTRAMUSCULAR | Status: AC
  Filled 2018-10-13: qty 2

## 2018-10-13 MED ORDER — PROPOFOL 10 MG/ML IV BOLUS
INTRAVENOUS | Status: AC
  Filled 2018-10-13: qty 20

## 2018-10-13 MED ORDER — PROPOFOL 500 MG/50ML IV EMUL
INTRAVENOUS | Status: AC
  Filled 2018-10-13: qty 50

## 2018-10-13 MED ORDER — SODIUM CHLORIDE 0.9 % IV SOLN
INTRAVENOUS | Status: DC | PRN
  Administered 2018-10-13: 11:00:00 via INTRAVENOUS

## 2018-10-13 MED ORDER — PROPOFOL 10 MG/ML IV BOLUS
INTRAVENOUS | Status: DC | PRN
  Administered 2018-10-13: 70 mg via INTRAVENOUS

## 2018-10-13 MED ORDER — LISINOPRIL 10 MG PO TABS
10.0000 mg | ORAL_TABLET | Freq: Every day | ORAL | Status: DC
  Administered 2018-10-13: 10 mg via ORAL

## 2018-10-13 MED ORDER — PROPOFOL 500 MG/50ML IV EMUL
INTRAVENOUS | Status: DC | PRN
  Administered 2018-10-13: 140 ug/kg/min via INTRAVENOUS

## 2018-10-13 MED ORDER — AMLODIPINE BESYLATE 10 MG PO TABS: 10.0000 mg | ORAL_TABLET | Freq: Every day | ORAL | 0 refills | Status: AC

## 2018-10-13 MED ORDER — FENTANYL CITRATE (PF) 100 MCG/2ML IJ SOLN
INTRAMUSCULAR | Status: DC | PRN
  Administered 2018-10-13 (×2): 25 ug via INTRAVENOUS

## 2018-10-13 NOTE — Discharge Instructions (Addendum)
Gastrointestinal Bleeding Gastrointestinal (GI) bleeding is bleeding somewhere along the path that food travels through the body (digestive tract). This path is anywhere between the mouth and the opening of the butt (anus). You may have blood in your poop (stool) or have black poop. If you throw up (vomit), there may be blood in it. This condition can be mild, serious, or even life-threatening. If you have a lot of bleeding, you may need to stay in the hospital. What are the causes? This condition may be caused by:  Irritation and swelling of the esophagus (esophagitis). The esophagus is part of the body that moves food from your mouth to your stomach.  Swollen veins in the butt (hemorrhoids).  Areas of painful tearing in the opening of the butt (anal fissures). These are often caused by passing hard poop.  Pouches that form on the colon over time (diverticulosis).  Irritation and swelling (diverticulitis) in areas where pouches have formed on the colon.  Growths (polyps) or cancer. Colon cancer often starts out as growths that are not cancer.  Irritation of the stomach lining (gastritis).  Sores (ulcers) in the stomach. What increases the risk? You are more likely to develop this condition if you:  Have a certain type of infection in your stomach (Helicobacter pylori infection).  Take certain medicines.  Smoke.  Drink alcohol. What are the signs or symptoms? Common symptoms of this condition include:  Throwing up (vomiting) material that has bright red blood in it. It may look like coffee grounds.  Changes in your poop. The poop may: ? Have red blood in it. ? Be black, look like tar, and smell stronger than normal. ? Be red.  Pain or cramping in the belly (abdomen). How is this treated? Treatment for this condition depends on the cause of the bleeding. For example:  Sometimes, the bleeding can be stopped during a procedure that is done to find the problem (endoscopy  or colonoscopy).  Medicines can be used to: ? Help control irritation, swelling, or infection. ? Reduce acid in your stomach.  Certain problems can be treated with: ? Creams. ? Medicines that are put in the butt (suppositories). ? Warm baths.  Surgery is sometimes needed.  If you lose a lot of blood, you may need a blood transfusion. If bleeding is mild, you may be allowed to go home. If there is a lot of bleeding, you will need to stay in the hospital. Follow these instructions at home:   Take over-the-counter and prescription medicines only as told by your doctor.  Eat foods that have a lot of fiber in them. These foods include beans, whole grains, and fresh fruits and vegetables. You can also try eating 1-3 prunes each day.  Drink enough fluid to keep your pee (urine) pale yellow.  Keep all follow-up visits as told by your doctor. This is important. Contact a doctor if:  Your symptoms do not get better. Get help right away if:  Your bleeding does not stop.  You feel dizzy or you pass out (faint).  You feel weak.  You have very bad cramps in your back or belly.  You pass large clumps of blood (clots) in your poop.  Your symptoms are getting worse.  You have chest pain or fast heartbeats. Summary  GI bleeding is bleeding somewhere along the path that food travels through the body (digestive tract).  This bleeding can be caused by many things. Treatment depends on the cause of the bleeding.  Take medicines only as told by your doctor.  Keep all follow-up visits as told by your doctor. This is important. This information is not intended to replace advice given to you by your health care provider. Make sure you discuss any questions you have with your health care provider. Document Released: 12/31/2007 Document Revised: 11/03/2017 Document Reviewed: 11/03/2017 Elsevier Patient Education  2020 [place] you for allowing [place] to participate in your  care!    You were admitted for dark stools that appeared to be related to a belly bleed. After being admitted you're belly pain resolved. You had an endoscopy performed where they look in your throat and stomach, and they did not notice any active bleeding or find any worrisome findings. You will need to follow up with your primary care doctor or your GI doctor at [facility] if you have any further concerns.   You were started on a blood pressure medication called amlodipine. Please take 1, 10mg  tablet every night to help with your blood pressure. You will need to follow up with your regular doctor to have them recheck your blood pressure and make sure you do not need any further adjustment in medication.   If you experience worsening of your admission symptoms, develop shortness of breath, life threatening emergency, suicidal or homicidal thoughts you must seek medical attention immediately by calling 911 or calling your MD immediately  if symptoms less severe.

## 2018-10-13 NOTE — Progress Notes (Addendum)
[place] [facility] [place] NAME: Seth Brown    MR#:  [id number]  DATE OF BIRTH:  Mar 04, 1961  SUBJECTIVE:  CHIEF COMPLAINT:   Chief Complaint  Patient presents with  . GI Bleeding   Patient reports that he is not having anymore abdominal pain and has not had anymore bowel movements since being admitted.. he is eager to have his EGD done and then be able to go home. He reports that he had about 400mg  ibuprofen or 2 tabs before he noticed this pain, but that he rarely uses any NSAIDs and denies any other NSAID use other than occasional ibuprofen. He also denies using any steroids.  REVIEW OF SYSTEMS:  Review of Systems  Cardiovascular: Negative for chest pain.  Gastrointestinal: Negative for abdominal pain and diarrhea.    DRUG ALLERGIES:   Allergies  Allergen Reactions  . Amoxicillin Hives   VITALS:  Blood pressure (!) 128/91, pulse (!) 57, temperature 97.6 F (36.4 C), temperature source Oral, resp. rate 20, height 5\' 10"  (1.778 m), weight 87.6 kg, SpO2 100 %. PHYSICAL EXAMINATION:  Physical Exam Vitals signs reviewed.  Constitutional:      General: He is not in acute distress.    Appearance: Normal appearance.  HENT:     Head: Normocephalic and atraumatic.  Pulmonary:     Effort: Pulmonary effort is normal.  Abdominal:     General: Abdomen is flat.     Palpations: Abdomen is soft.  Musculoskeletal:     Right lower leg: No edema.     Left lower leg: No edema.  Skin:    General: Skin is warm.  Neurological:     General: No focal deficit present.     Mental Status: He is alert.  Psychiatric:        Mood and Affect: Mood normal.        Behavior: Behavior normal.    LABORATORY PANEL:  Male CBC Recent Labs  Lab 10/13/18 0541  WBC 6.6  HGB 13.5  HCT 40.8  PLT 224   ------------------------------------------------------------------------------------------------------------------ Chemistries  Recent Labs  Lab  10/12/18 1335 10/13/18 0541  NA 141 140  K 4.0 4.2  CL 109 105  CO2 23 28  GLUCOSE 125* 112*  BUN 12 12  CREATININE 0.93 1.15  CALCIUM 9.2 9.0  AST 33  --   ALT 27  --   ALKPHOS 49  --   BILITOT 0.4  --    RADIOLOGY:  No results found. ASSESSMENT AND PLAN:  Seth Brown is a 58 yo male with a history of HTN, chronic hep C, chronic low back pain who presented with upset stomach and found to have upper GI bleed.   1.  Upper GI bleed with melena- patient hemodynamically stable - continue IV protonix - GI consulted for endoscopy today - Rapid COVID neg  2. HTN: previously on HCTZ, but stopped about a year ago due to improvement with diet and exercise - amlodipine 10mg  started during admission, and patient given lisinopril 10mg  x1 - monitor BP - patient will need follow up with PCP for htn management  3. Chronic back pain - home meds include: Oxycodone 15 TID, Xtampaza ER 13.5 BID per PDMP - here patient receiving Oxycodone 15 q12 with percocet 5-325mg  q6 prn (required x1)  All the records are reviewed and case discussed with Care Management/Social Worker. Management plans discussed with the patient, family and they are in agreement.  CODE STATUS:  Full Code  POSSIBLE D/C TODAY, DEPENDING ON CLINICAL CONDITION.  [place] Kin Galbraith, DO PGY-3, [facility]  10/13/2018 at 7:22 AM  Between 7am to 6pm - Pager - [phone]  After 6pm go to [email or web address] - Scientist, research (life sciences)password [email or web address]  [employer] [facility]  Office  [phone]  CC: Primary care physician; Inc, [facility]

## 2018-10-13 NOTE — Consult Note (Signed)
Lucilla Lame, MD [facility]  [address], [place], [place] [address] Phone: [phone] Fax : [phone]  Consultation  Referring Provider:     Dr. Leslye Peer Primary Care Physician:  [facility], [facility] Primary Gastroenterologist: GI at [facility]         Reason for Consultation:     Melena  Date of Admission:  10/12/2018 Date of Consultation:  10/13/2018         HPI:   Seth [facility] is a 58 y.o. male who was admitted to the [facility] yesterday for GI bleed.  Patient reports that he had abdominal pain about a week ago that is no longer bothering him.  He did report that he started to have black stools yesterday.  He reports the stools were soft and had 2-3 bowel movements that were dark.  There is no report of any nausea or vomiting although he states that he had a lot of coughing.  The patient's last colonoscopy was 2 years ago at [facility].  There is no report of any recent weight loss fevers chills or hematochezia.  I would not be asked to see the patient for his melena.  The patient's hemoglobin was 13.8 on admission and 13.5 today.  Past Medical History:  Diagnosis Date  . Hypertension     Past Surgical History:  Procedure Laterality Date  . BACK SURGERY      Prior to Admission medications   Medication Sig Start Date End Date Taking? Authorizing Provider  brimonidine (ALPHAGAN) 0.2 % ophthalmic solution Place 1 drop into both eyes 3 (three) times daily.   Yes [provider]  oxyCODONE (ROXICODONE) 15 MG immediate release tablet Take 15 mg by mouth 3 (three) times daily.   Yes [provider]  ipratropium (ATROVENT) 0.06 % nasal spray Place 2 sprays into both nostrils 4 (four) times daily as needed for rhinitis. Patient not taking: Reported on 10/12/2018 04/21/18   Coral Spikes, DO    Family History  Problem Relation Age of Onset  . CAD Mother   . COPD Father      Social History   Tobacco Use  . Smoking status: Never Smoker  . Smokeless  tobacco: Never Used  Substance Use Topics  . Alcohol use: No  . Drug use: No    Allergies as of 10/12/2018 - Review Complete 10/12/2018  Allergen Reaction Noted  . Amoxicillin Hives 01/25/2016    Review of Systems:    All systems reviewed and negative except where noted in HPI.   Physical Exam:  Vital signs in last 24 hours: Temp:  [96.1 F (35.6 C)-98.6 F (37 C)] 97.6 F (36.4 C) (07/09 1123) Pulse Rate:  [57-105] 105 (07/09 1123) Resp:  [15-20] 15 (07/09 1123) BP: (97-202)/(75-116) 112/87 (07/09 1132) SpO2:  [95 %-100 %] 95 % (07/09 1123) Weight:  [87.1 kg-88.5 kg] 87.1 kg (07/09 1048) Last BM Date: 10/12/18 General:   Pleasant, cooperative in NAD Head:  Normocephalic and atraumatic. Eyes:   No icterus.   Conjunctiva pink. PERRLA. Ears:  Normal auditory acuity. Neck:  Supple; no masses or thyroidomegaly Lungs: Respirations even and unlabored. Lungs clear to auscultation bilaterally.   No wheezes, crackles, or rhonchi.  Heart:  Regular rate and rhythm;  Without murmur, clicks, rubs or gallops Abdomen:  Soft, nondistended, nontender. Normal bowel sounds. No appreciable masses or hepatomegaly.  No rebound or guarding.  Rectal:  Not performed. Msk:  Symmetrical without gross deformities.    Extremities:  Without edema, cyanosis or clubbing. Neurologic:  Alert and oriented x3;  grossly normal neurologically. Skin:  Intact without significant lesions or rashes. Cervical Nodes:  No significant cervical adenopathy. Psych:  Alert and cooperative. Normal affect.  LAB RESULTS: Recent Labs    10/12/18 1335 10/13/18 0541  WBC 6.5 6.6  HGB 13.8 13.5  HCT 40.8 40.8  PLT 249 224   BMET Recent Labs    10/12/18 1335 10/13/18 0541  NA 141 140  K 4.0 4.2  CL 109 105  CO2 23 28  GLUCOSE 125* 112*  BUN 12 12  CREATININE 0.93 1.15  CALCIUM 9.2 9.0   LFT Recent Labs    10/12/18 1335  PROT 7.9  ALBUMIN 4.6  AST 33  ALT 27  ALKPHOS 49  BILITOT 0.4   PT/INR No  results for input(s): LABPROT, INR in the last 72 hours.  STUDIES: No results found.    Impression / Plan:   Assessment: Active Problems:   Melena   Seth [facility] is a 58 y.o. y/o male with with admission for melena with a stable hemoglobin hematocrit.  The patient has had no further signs of any GI bleeding.  The patient also reports that his abdominal pain has completely resolved.  The patient has not had any nausea vomiting.  He also had [facility].  Plan:  The patient will be set up for a an EGD because of his melena.  The patient's hemoglobin has been stable.  If his EGD is negative then the patient can be discharged with outpatient follow-up at his PCP for any further signs of bleeding or his GI doctor Seth [facility].  The patient has been explained the plan and agrees with it.  Thank you for involving me in the care of this patient.      LOS: 0 days   Seth Miniumarren Keondra Haydu, MD  10/13/2018, 11:47 AM    Note: This dictation was prepared with Dragon dictation along with smaller phrase technology. Any transcriptional errors that result from this process are unintentional.

## 2018-10-13 NOTE — Op Note (Signed)
[facility] Patient Name: Seth [facility] Procedure Date: 10/13/2018 11:01 AM MRN: [id number] Account #: [id number] Date of Birth: 07-04-1960 Admit Type: Inpatient Age: 59 Room: [facility] END[facility] ROOM 2 Gender: Male Note Status: Finalized Procedure:            Upper GI endoscopy Indications:          Melena Providers:            Lucilla Lame MD, MD Medicines:            Propofol per Anesthesia Complications:        No immediate complications. Procedure:            Pre-Anesthesia Assessment:                       - Prior to the procedure, a History and Physical was                        performed, and patient medications and allergies were                        reviewed. The patient's tolerance of previous                        anesthesia was also reviewed. The risks and benefits of                        the procedure and the sedation options and risks were                        discussed with the patient. All questions were                        answered, and informed consent was obtained. Prior                        Anticoagulants: The patient has taken no previous                        anticoagulant or antiplatelet agents. ASA Grade                        Assessment: II - A patient with mild systemic disease.                        After reviewing the risks and benefits, the patient was                        deemed in satisfactory condition to undergo the                        procedure.                       After obtaining informed consent, the endoscope was                        passed under direct vision. Throughout the procedure,                        the patient's blood pressure, pulse, and  oxygen                        saturations were monitored continuously. The Endoscope                        was introduced through the mouth, and advanced to the                        second part of duodenum. The upper GI endoscopy was          accomplished without difficulty. The patient tolerated                        the procedure well. Findings:      The examined esophagus was normal.      The entire examined stomach was normal.      The examined duodenum was normal. Impression:           - Normal esophagus.                       - Normal stomach.                       - Normal examined duodenum.                       - No specimens collected. Recommendation:       - Return patient to hospital ward for ongoing care.                       - Resume previous diet.                       - Continue present medications. Procedure Code(s):    --- Professional ---                       [id number], Esophagogastroduodenoscopy, flexible, transoral;                        diagnostic, including collection of specimen(s) by                        brushing or washing, when performed (separate procedure) Diagnosis Code(s):    --- Professional ---                       K92.1, Melena (includes Hematochezia) CPT copyright 2019 American Medical Association. All rights reserved. The codes documented in this report are preliminary and upon coder review may  be revised to meet current compliance requirements. Midge Miniumarren Emaree Chiu MD, MD 10/13/2018 11:41:52 AM This report has been signed electronically. Number of Addenda: 0 Note Initiated On: 10/13/2018 11:01 AM Estimated Blood Loss: Estimated blood loss: none.      [facility]

## 2018-10-13 NOTE — Anesthesia Post-op Follow-up Note (Signed)
Anesthesia QCDR form completed.        

## 2018-10-13 NOTE — Anesthesia Postprocedure Evaluation (Signed)
Anesthesia Post Note  Patient: Traven Davids Benedict  Procedure(s) Performed: ESOPHAGOGASTRODUODENOSCOPY (EGD) WITH PROPOFOL (N/A )  Patient location during evaluation: Endoscopy Anesthesia Type: General Level of consciousness: awake and alert Pain management: pain level controlled Vital Signs Assessment: post-procedure vital signs reviewed and stable Respiratory status: spontaneous breathing, nonlabored ventilation, respiratory function stable and patient connected to nasal cannula oxygen Cardiovascular status: blood pressure returned to baseline and stable Postop Assessment: no apparent nausea or vomiting Anesthetic complications: no     Last Vitals:  Vitals:   10/13/18 1152 10/13/18 1218  BP: 123/90 (!) 142/98  Pulse: 70 75  Resp: 15   Temp:  (!) 36.4 C  SpO2: 97% 100%    Last Pain:  Vitals:   10/13/18 1218  TempSrc: Oral  PainSc:                  Martha Clan

## 2018-10-13 NOTE — Transfer of Care (Signed)
Immediate Anesthesia Transfer of Care Note  Patient: Seth Brown  Procedure(s) Performed: ESOPHAGOGASTRODUODENOSCOPY (EGD) WITH PROPOFOL (N/A )  Patient Location: PACU  Anesthesia Type:General  Level of Consciousness: sedated  Airway & Oxygen Therapy: Patient Spontanous Breathing and Patient connected to nasal cannula oxygen  Post-op Assessment: Report given to RN and Post -op Vital signs reviewed and stable  Post vital signs: Reviewed and stable  Last Vitals:  Vitals Value Taken Time  BP 97/75 10/13/18 1123  Temp 36.4 C 10/13/18 1123  Pulse 105 10/13/18 1123  Resp 14 10/13/18 1123  SpO2 98 % 10/13/18 1123  Vitals shown include unvalidated device data.  Last Pain:  Vitals:   10/13/18 1123  TempSrc:   PainSc: 0-No pain      Patients Stated Pain Goal: 2 (95/74/73 4037)  Complications: No apparent anesthesia complications

## 2018-10-13 NOTE — Plan of Care (Signed)
EGD was negative.  Pt ready for discharge home.   Problem: Education: Goal: Knowledge of General Education information will improve Description: Including pain rating scale, medication(s)/side effects and non-pharmacologic comfort measures Outcome: Completed/Met   Problem: Education: Goal: Ability to identify signs and symptoms of gastrointestinal bleeding will improve Outcome: Completed/Met   Problem: Bowel/Gastric: Goal: Will show no signs and symptoms of gastrointestinal bleeding Outcome: Completed/Met   Problem: Fluid Volume: Goal: Will show no signs and symptoms of excessive bleeding Outcome: Completed/Met   Problem: Clinical Measurements: Goal: Complications related to the disease process, condition or treatment will be avoided or minimized Outcome: Completed/Met

## 2018-10-13 NOTE — Anesthesia Preprocedure Evaluation (Signed)
Anesthesia Evaluation  Patient identified by MRN, date of birth, ID band Patient awake    Reviewed: Allergy & Precautions, H&P , NPO status , Patient's Chart, lab work & pertinent test results, reviewed documented beta blocker date and time   History of Anesthesia Complications Negative for: history of anesthetic complications  Airway Mallampati: II  TM Distance: >3 FB Neck ROM: full    Dental  (+) Dental Advidsory Given, Partial Upper, Partial Lower, Missing, Teeth Intact   Pulmonary neg pulmonary ROS,           Cardiovascular Exercise Tolerance: Good hypertension, (-) angina(-) Past MI (-) dysrhythmias (-) Valvular Problems/Murmurs     Neuro/Psych negative neurological ROS  negative psych ROS   GI/Hepatic negative GI ROS, (+) Hepatitis -, C  Endo/Other  negative endocrine ROS  Renal/GU negative Renal ROS  negative genitourinary   Musculoskeletal   Abdominal   Peds  Hematology negative hematology ROS (+)   Anesthesia Other Findings Past Medical History: No date: Hypertension   Reproductive/Obstetrics negative OB ROS                             Anesthesia Physical Anesthesia Plan  ASA: II  Anesthesia Plan: General   Post-op Pain Management:    Induction: Intravenous  PONV Risk Score and Plan: 2 and Propofol infusion and TIVA  Airway Management Planned: Natural Airway and Nasal Cannula  Additional Equipment:   Intra-op Plan:   Post-operative Plan:   Informed Consent: I have reviewed the patients History and Physical, chart, labs and discussed the procedure including the risks, benefits and alternatives for the proposed anesthesia with the patient or authorized representative who has indicated his/her understanding and acceptance.     Dental Advisory Given  Plan Discussed with: Anesthesiologist, CRNA and Surgeon  Anesthesia Plan Comments:         Anesthesia Quick  Evaluation

## 2018-10-13 NOTE — Discharge Summary (Addendum)
[facility] - [place] [facility]   PATIENT NAME: Seth [facility]    MR#:  [id number]  DATE OF BIRTH:  05-03-1960  DATE OF ADMISSION:  10/12/2018   ADMITTING PHYSICIAN: Alford Highlandichard Wieting, MD  DATE OF DISCHARGE: No discharge date for patient encounter.  PRIMARY CARE PHYSICIAN: [facility]   ADMISSION DIAGNOSIS:  Melena [K92.1] DISCHARGE DIAGNOSIS:  Active Problems:   Melena  SECONDARY DIAGNOSIS:   Past Medical History:  Diagnosis Date  . Hypertension    HOSPITAL COURSE:  History of presenting illness: Cardell Peachnthony Lafoy Stiger is a 58 yo male with a history of HTN, chronic hep C, chronic low back pain who presented with stomach discomfort for 1 week, but not a pain.  Patient endorsed  black stools for 2-3 soft bowel movements. In the ER he was hemodynamically stable, with guaiac positive black stool.  He was admitted by the hospitalist for GI evaluation with EGD.    Hospital course: 1. Upper GI bleed with melena- patient started on IV protonix and hemoglobin as well as vitals remained stable throughout stay. GI was consulted and performed an endoscopy on 07/09 showing: The examined esophagus was normal. The entire examined stomach was normal. The examined duodenum was normal. Patient is to follow up with his regular doctor or [facility] if he experiences any further issues.   2. HTN- previously on HCTZ, but stopped about a year ago due to improvement with diet and exercise. While admitted patient's BP remained elevated. He was started on amlodipine 10mg  and given lisinopril 10mg  x1. Patient sent home on amlodipine 10mg . He will need follow up with his PCP for further BP management.   3. Chronic back pain- per PDMP, patient on high doses of opiods including Oxycodone 15 TID , Xtampaza ER 13.5 BID; While admitted patient given Oxycodone 15 q12 with percocet 5-325mg  q6 prn (required x1). May benefit from weaning opioids given risks  associated with long term high dose opioids.   DISCHARGE CONDITIONS:  stable CONSULTS OBTAINED:  Treatment Team:  Midge MiniumWohl, Darren, MD DRUG ALLERGIES:   Allergies  Allergen Reactions  . Amoxicillin Hives   DISCHARGE MEDICATIONS:   Allergies as of 10/13/2018      Reactions   Amoxicillin Hives      Medication List    TAKE these medications   amLODipine 10 MG tablet Commonly known as: NORVASC Take 1 tablet (10 mg total) by mouth daily. Start taking on: October 14, 2018   brimonidine 0.2 % ophthalmic solution Commonly known as: ALPHAGAN Place 1 drop into both eyes 3 (three) times daily.   ipratropium 0.06 % nasal spray Commonly known as: ATROVENT Place 2 sprays into both nostrils 4 (four) times daily as needed for rhinitis.   oxyCODONE 15 MG immediate release tablet Commonly known as: ROXICODONE Take 15 mg by mouth 3 (three) times daily.        DISCHARGE INSTRUCTIONS:   DIET:  Regular diet DISCHARGE CONDITION:  Good ACTIVITY:  Activity as tolerated OXYGEN:  Home Oxygen: No.  Oxygen Delivery: n/a DISCHARGE LOCATION:  home   If you experience worsening of your admission symptoms, develop shortness of breath, life threatening emergency, suicidal or homicidal thoughts you must seek medical attention immediately by calling 911 or calling your MD immediately  if symptoms less severe.  You Must read complete instructions/literature along with all the possible adverse reactions/side effects for all the Medicines you take and that have been prescribed to you.  Take any new Medicines after you have completely understood and accpet all the possible adverse reactions/side effects.   Please note  You were cared for by a hospitalist during your hospital stay. If you have any questions about your discharge medications or the care you received while you were in the hospital after you are discharged, you can call the unit and asked to speak with the hospitalist on call if the  hospitalist that took care of you is not available. Once you are discharged, your primary care physician will handle any further medical issues. Please note that NO REFILLS for any discharge medications will be authorized once you are discharged, as it is imperative that you return to your primary care physician (or establish a relationship with a primary care physician if you do not have one) for your aftercare needs so that they can reassess your need for medications and monitor your lab values.    On the day of Discharge:  VITAL SIGNS:  Blood pressure 123/90, pulse 70, temperature 97.6 F (36.4 C), resp. rate 15, height 5\' 10"  (1.778 m), weight 87.1 kg, SpO2 97 %. PHYSICAL EXAMINATION:  GENERAL:  58 y.o.-year-old patient lying in the bed with no acute distress.  EYES: No scleral icterus.  HEENT: Head atraumatic, normocephalic.  NECK:  Supple LUNGS: Normal breath sounds bilaterally. No use of accessory muscles of respiration.  CARDIOVASCULAR: S1, S2 normal. No murmurs, rubs, or gallops.  ABDOMEN: Soft, non-tender, non-distended.  EXTREMITIES: No pedal edema, cyanosis, or clubbing.  NEUROLOGIC: no focal deficit noted PSYCHIATRIC: The patient is alert and oriented.  SKIN: No obvious rash, lesion, or ulcer.  DATA REVIEW:   CBC Recent Labs  Lab 10/13/18 0541  WBC 6.6  HGB 13.5  HCT 40.8  PLT 224    Chemistries  Recent Labs  Lab 10/12/18 1335 10/13/18 0541  NA 141 140  K 4.0 4.2  CL 109 105  CO2 23 28  GLUCOSE 125* 112*  BUN 12 12  CREATININE 0.93 1.15  CALCIUM 9.2 9.0  AST 33  --   ALT 27  --   ALKPHOS 49  --   BILITOT 0.4  --      Microbiology Results   10/12/2018 SARS Coronavirus 2 NEGATIVE    RADIOLOGY:  No results found.   Management plans discussed with the patient, family and they are in agreement.  CODE STATUS: Full Code   [place] Darnette Lampron, DO PGY-3, [facility]  10/13/2018 at 12:01 PM  Between 7am to 6pm - Pager - [phone]   After 6pm go to [email or web address] - [employer] [facility]  Office  [phone]  CC: Primary care physician; [employer]

## 2018-10-14 LAB — HIV ANTIBODY (ROUTINE TESTING W REFLEX): HIV Screen 4th Generation wRfx: NONREACTIVE

## 2019-02-06 ENCOUNTER — Other Ambulatory Visit: Payer: Self-pay

## 2019-02-06 DIAGNOSIS — Z20822 Contact with and (suspected) exposure to covid-19: Secondary | ICD-10-CM

## 2019-02-06 NOTE — Progress Notes (Unsigned)
labb

## 2019-02-07 LAB — NOVEL CORONAVIRUS, NAA: SARS-CoV-2, NAA: NOT DETECTED

## 2019-05-01 ENCOUNTER — Ambulatory Visit: Payer: Medicaid Other | Attending: Internal Medicine

## 2019-05-01 DIAGNOSIS — Z20822 Contact with and (suspected) exposure to covid-19: Secondary | ICD-10-CM

## 2019-05-02 LAB — NOVEL CORONAVIRUS, NAA: SARS-CoV-2, NAA: NOT DETECTED

## 2019-05-19 ENCOUNTER — Other Ambulatory Visit: Payer: Medicaid Other

## 2019-05-22 ENCOUNTER — Ambulatory Visit: Payer: Medicaid Other | Attending: Internal Medicine

## 2019-05-22 DIAGNOSIS — Z20822 Contact with and (suspected) exposure to covid-19: Secondary | ICD-10-CM

## 2019-05-23 LAB — NOVEL CORONAVIRUS, NAA: SARS-CoV-2, NAA: DETECTED — AB

## 2019-05-24 ENCOUNTER — Telehealth: Payer: Self-pay | Admitting: Physician Assistant

## 2019-05-24 NOTE — Telephone Encounter (Signed)
Called to discuss with Cardell Peach about Covid symptoms and the use of bamlanivimab or casirivimab/imdevimab, a monoclonal antibody infusion for those with mild to moderate Covid symptoms and at a high risk of hospitalization.     Pt is qualified for this infusion at the [facility] infusion center due to co-morbid conditions and/or a member of an at-risk group (55 + HTN), however declines infusion at this time. Symptoms tier reviewed as well as criteria for ending isolation.  Symptoms reviewed that would warrant ED/Hospital evaluation. Preventative practices reviewed. Patient verbalized understanding. Patient advised to call back if he decides that he does want to get infusion. Callback number to the infusion center given. Patient advised to go to Urgent care or ED with severe symptoms. Last date pt would be eligible for infusion is 05/31/19.    Patient Active Problem List   Diagnosis Date Noted  . Melena 10/12/2018  . Essential hypertension 04/10/2015  . Chronic pain syndrome 09/12/2012  . Chronic low back pain 09/12/2012  . Chronic hepatitis C virus infection (HCC) 08/01/2012    Cline Crock PA-C

## 2019-11-26 ENCOUNTER — Emergency Department: Payer: Medicaid Other

## 2019-11-26 ENCOUNTER — Other Ambulatory Visit: Payer: Self-pay

## 2019-11-26 ENCOUNTER — Emergency Department
Admission: EM | Admit: 2019-11-26 | Discharge: 2019-11-26 | Disposition: A | Discharge status: Home / Self Care | Payer: Medicaid Other | Attending: Emergency Medicine | Admitting: Emergency Medicine

## 2019-11-26 DIAGNOSIS — Z20822 Contact with and (suspected) exposure to covid-19: Secondary | ICD-10-CM | POA: Diagnosis not present

## 2019-11-26 DIAGNOSIS — R05 Cough: Secondary | ICD-10-CM | POA: Diagnosis present

## 2019-11-26 DIAGNOSIS — J069 Acute upper respiratory infection, unspecified: Secondary | ICD-10-CM | POA: Insufficient documentation

## 2019-11-26 DIAGNOSIS — I10 Essential (primary) hypertension: Secondary | ICD-10-CM | POA: Insufficient documentation

## 2019-11-26 DIAGNOSIS — Z79899 Other long term (current) drug therapy: Secondary | ICD-10-CM | POA: Insufficient documentation

## 2019-11-26 MED ORDER — BENZONATATE 100 MG PO CAPS: 100.0000 mg | ORAL_CAPSULE | Freq: Three times a day (TID) | ORAL | 0 refills | Status: AC | PRN

## 2019-11-26 MED ORDER — FLUTICASONE PROPIONATE 50 MCG/ACT NA SUSP: 2.0000 | Freq: Every day | NASAL | 0 refills | Status: DC

## 2019-11-26 NOTE — ED Notes (Signed)
E-signature not working at this time. Pt verbalized understanding of D/C instructions, prescriptions and follow up care with no further questions at this time. Pt in NAD and ambulatory at time of D/C.  

## 2019-11-26 NOTE — ED Provider Notes (Signed)
[facility] Emergency Department Provider Note  ____________________________________________  Time seen: Approximately 11:55 AM  I have reviewed the triage vital signs and the nursing notes.   HISTORY  Chief Complaint Cough    HPI Seth Brown is a 59 y.o. male that presents to the emergency department for evaluation of productive cough with phlem for 3-4 days.  He did have an episode of diarrhea today.  Patient had Covid about 4 to 5 months ago.  He has had the first [employer] vaccination.  No sick contacts.  No fevers, nasal congestion, shortness of breath, chest pain, vomiting.  Past Medical History:  Diagnosis Date  . Hypertension     Patient Active Problem List   Diagnosis Date Noted  . Melena 10/12/2018  . Essential hypertension 04/10/2015  . Chronic pain syndrome 09/12/2012  . Chronic low back pain 09/12/2012  . Chronic hepatitis C virus infection (HCC) 08/01/2012    Past Surgical History:  Procedure Laterality Date  . BACK SURGERY    . ESOPHAGOGASTRODUODENOSCOPY (EGD) WITH PROPOFOL N/A 10/13/2018   Procedure: ESOPHAGOGASTRODUODENOSCOPY (EGD) WITH PROPOFOL;  Surgeon: Midge Minium, MD;  Location: [facility] ENDOSCOPY;  Service: Endoscopy;  Laterality: N/A;    Prior to Admission medications   Medication Sig Start Date End Date Taking? Authorizing Provider  amLODipine (NORVASC) 10 MG tablet Take 1 tablet (10 mg total) by mouth daily. 10/14/18   Shirley, [place], DO  benzonatate (TESSALON PERLES) 100 MG capsule Take 1 capsule (100 mg total) by mouth 3 (three) times daily as needed. 11/26/19 11/25/20  Enid Derry, PA-C  brimonidine (ALPHAGAN) 0.2 % ophthalmic solution Place 1 drop into both eyes 3 (three) times daily.    [provider]  fluticasone (FLONASE) 50 MCG/ACT nasal spray Place 2 sprays into both nostrils daily. 11/26/19 11/25/20  Enid Derry, PA-C  ipratropium (ATROVENT) 0.06 % nasal spray Place 2 sprays into both nostrils 4 (four)  times daily as needed for rhinitis. Patient not taking: Reported on 10/12/2018 04/21/18   Tommie Sams, DO  oxyCODONE (ROXICODONE) 15 MG immediate release tablet Take 15 mg by mouth 3 (three) times daily.    [provider]    Allergies Amoxicillin  Family History  Problem Relation Age of Onset  . CAD Mother   . COPD Father     Social History Social History   Tobacco Use  . Smoking status: Never Smoker  . Smokeless tobacco: Never Used  Vaping Use  . Vaping Use: Never used  Substance Use Topics  . Alcohol use: No  . Drug use: No     Review of Systems  Constitutional: No fever/chills Eyes: No visual changes. No discharge. ENT: Negative for congestion and rhinorrhea. Cardiovascular: No chest pain. Respiratory: Positive for cough. No SOB. Gastrointestinal: No abdominal pain.  No nausea, no vomiting.  No diarrhea.  No constipation. Musculoskeletal: Negative for musculoskeletal pain. Skin: Negative for rash, abrasions, lacerations, ecchymosis. Neurological: Negative for headaches.   ____________________________________________   PHYSICAL EXAM:  VITAL SIGNS: ED Triage Vitals  Enc Vitals Group     BP 11/26/19 0935 (!) 139/95     Pulse Rate 11/26/19 0935 76     Resp 11/26/19 0935 16     Temp 11/26/19 0935 97.8 F (36.6 C)     Temp src --      SpO2 11/26/19 0935 93 %     Weight 11/26/19 0931 190 lb (86.2 kg)     Height --      Head  Circumference --      Peak Flow --      Pain Score 11/26/19 0931 8     Pain Loc --      Pain Edu? --      Excl. in GC? --      Constitutional: Alert and oriented. Well appearing and in no acute distress. Eyes: Conjunctivae are normal. PERRL. EOMI. No discharge. Head: Atraumatic. ENT: No frontal and maxillary sinus tenderness.      Ears: Tympanic membranes pearly gray with good landmarks. No discharge.      Nose: Mild congestion/rhinnorhea.      Mouth/Throat: Mucous membranes are moist. Oropharynx non-erythematous.  Tonsils not enlarged. No exudates. Uvula midline. Neck: No stridor.   Hematological/Lymphatic/Immunilogical: No cervical lymphadenopathy. Cardiovascular: Normal rate, regular rhythm.  Good peripheral circulation. Respiratory: Normal respiratory effort without tachypnea or retractions. Lungs CTAB. Good air entry to the bases with no decreased or absent breath sounds. Gastrointestinal: Bowel sounds 4 quadrants. Soft and nontender to palpation. No guarding or rigidity. No palpable masses. No distention. Musculoskeletal: Full range of motion to all extremities. No gross deformities appreciated. Neurologic:  Normal speech and language. No gross focal neurologic deficits are appreciated.  Skin:  Skin is warm, dry and intact. No rash noted. Psychiatric: Mood and affect are normal. Speech and behavior are normal. Patient exhibits appropriate insight and judgement.   ____________________________________________   LABS (all labs ordered are listed, but only abnormal results are displayed)  Labs Reviewed  SARS CORONAVIRUS 2 (TAT 6-24 HRS)   ____________________________________________  EKG   ____________________________________________  RADIOLOGY Lexine Baton, personally viewed and evaluated these images (plain radiographs) as part of my medical decision making, as well as reviewing the written report by the radiologist.  DG Chest 1 View  Result Date: 11/26/2019 CLINICAL DATA:  59 year old male with history of productive cough for the past 4 days. EXAM: CHEST  1 VIEW COMPARISON:  Chest x-ray 12/07/2017. FINDINGS: Lung volumes are normal. No consolidative airspace disease. No pleural effusions. No pneumothorax. No pulmonary nodule or mass noted. Pulmonary vasculature and the cardiomediastinal silhouette are within normal limits. IMPRESSION: No radiographic evidence of acute cardiopulmonary disease. Electronically Signed   By: Trudie Reed M.D.   On: 11/26/2019 12:00     ____________________________________________    PROCEDURES  Procedure(s) performed:    Procedures    Medications - No data to display   ____________________________________________   INITIAL IMPRESSION / ASSESSMENT AND PLAN / ED COURSE  Pertinent labs & imaging results that were available during my care of the patient were reviewed by me and considered in my medical decision making (see chart for details).  Review of the [place] CSRS was performed in accordance of the [facility] prior to dispensing any controlled drugs.     Patient's diagnosis is consistent with viral URI. Vital signs and exam are reassuring.  Chest x-ray negative for acute cardiopulmonary processes.  O2 sats remained above 95% with ambulation.  Covid test is pending.  Patient appears well and is staying well hydrated.Patient feels comfortable going home. Patient will be discharged home with prescriptions for [facility] and Flonase. Patient is to follow up with primary care as needed or otherwise directed. Patient is given ED precautions to return to the ED for any worsening or new symptoms.  Seth Brown was evaluated in Emergency Department on 11/26/2019 for the symptoms described in the history of present illness. He was evaluated in the context of the global COVID-19 pandemic, which necessitated consideration  that the patient might be at risk for infection with the SARS-CoV-2 virus that causes COVID-19. Institutional protocols and algorithms that pertain to the evaluation of patients at risk for COVID-19 are in a state of rapid change based on information released by regulatory bodies including the CDC and federal and state organizations. These policies and algorithms were followed during the patient's care in the ED.   ____________________________________________  FINAL CLINICAL IMPRESSION(S) / ED DIAGNOSES  Final diagnoses:  Upper respiratory tract infection, unspecified type      NEW  MEDICATIONS STARTED DURING THIS VISIT:  ED Discharge Orders         Ordered    fluticasone (FLONASE) 50 MCG/ACT nasal spray  Daily        11/26/19 1253    benzonatate (TESSALON PERLES) 100 MG capsule  3 times daily PRN        11/26/19 1253              This chart was dictated using voice recognition software/Dragon. Despite best efforts to proofread, errors can occur which can change the meaning. Any change was purely unintentional.    Enid Derry, PA-C 11/26/19 1615    Jene Every, MD 11/29/19 [phone]

## 2019-11-26 NOTE — ED Triage Notes (Signed)
Pt presents via POV c/o productive cough x4 days.

## 2019-11-27 ENCOUNTER — Telehealth: Payer: Self-pay

## 2019-11-27 LAB — SARS CORONAVIRUS 2 (TAT 6-24 HRS): SARS Coronavirus 2: NEGATIVE

## 2019-11-27 NOTE — Telephone Encounter (Signed)
Negative COVID results given. Patient results "NOT Detected." Caller expressed understanding. ° °

## 2020-03-03 ENCOUNTER — Emergency Department: Payer: Medicaid Other

## 2020-03-03 ENCOUNTER — Emergency Department
Admission: EM | Admit: 2020-03-03 | Discharge: 2020-03-03 | Disposition: A | Discharge status: Home / Self Care | Payer: Medicaid Other | Attending: Emergency Medicine | Admitting: Emergency Medicine

## 2020-03-03 ENCOUNTER — Encounter: Payer: Self-pay | Admitting: Emergency Medicine

## 2020-03-03 ENCOUNTER — Other Ambulatory Visit: Payer: Self-pay

## 2020-03-03 DIAGNOSIS — Y92410 Unspecified street and highway as the place of occurrence of the external cause: Secondary | ICD-10-CM | POA: Diagnosis not present

## 2020-03-03 DIAGNOSIS — I10 Essential (primary) hypertension: Secondary | ICD-10-CM | POA: Diagnosis not present

## 2020-03-03 DIAGNOSIS — M542 Cervicalgia: Secondary | ICD-10-CM | POA: Diagnosis present

## 2020-03-03 DIAGNOSIS — Z79899 Other long term (current) drug therapy: Secondary | ICD-10-CM | POA: Diagnosis not present

## 2020-03-03 DIAGNOSIS — M25512 Pain in left shoulder: Secondary | ICD-10-CM | POA: Insufficient documentation

## 2020-03-03 DIAGNOSIS — M62838 Other muscle spasm: Secondary | ICD-10-CM

## 2020-03-03 DIAGNOSIS — M25511 Pain in right shoulder: Secondary | ICD-10-CM | POA: Insufficient documentation

## 2020-03-03 MED ORDER — CYCLOBENZAPRINE HCL 5 MG PO TABS: ORAL_TABLET | ORAL | 0 refills | Status: DC

## 2020-03-03 MED ORDER — IBUPROFEN 600 MG PO TABS: 600.0000 mg | ORAL_TABLET | Freq: Four times a day (QID) | ORAL | 0 refills | Status: DC | PRN
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
## 2020-11-21 ENCOUNTER — Other Ambulatory Visit: Payer: Self-pay

## 2020-11-21 ENCOUNTER — Emergency Department: Payer: Medicaid Other

## 2020-11-21 ENCOUNTER — Encounter: Payer: Self-pay | Admitting: Physician Assistant

## 2020-11-21 ENCOUNTER — Emergency Department
Admission: EM | Admit: 2020-11-21 | Discharge: 2020-11-21 | Disposition: A | Discharge status: Home / Self Care | Payer: Medicaid Other | Attending: Emergency Medicine | Admitting: Emergency Medicine

## 2020-11-21 DIAGNOSIS — Z79899 Other long term (current) drug therapy: Secondary | ICD-10-CM | POA: Insufficient documentation

## 2020-11-21 DIAGNOSIS — M25522 Pain in left elbow: Secondary | ICD-10-CM | POA: Insufficient documentation

## 2020-11-21 DIAGNOSIS — G8929 Other chronic pain: Secondary | ICD-10-CM | POA: Insufficient documentation

## 2020-11-21 DIAGNOSIS — I10 Essential (primary) hypertension: Secondary | ICD-10-CM | POA: Diagnosis not present

## 2020-11-21 MED ORDER — DICLOFENAC SODIUM 50 MG PO TBEC: 50.0000 mg | DELAYED_RELEASE_TABLET | Freq: Two times a day (BID) | ORAL | 0 refills | Status: DC

## 2020-11-21 NOTE — Discharge Instructions (Addendum)
Take the prescription as directed. Follow-up with ortho or your pain management provider for further evaluation and treatment.

## 2020-11-21 NOTE — ED Provider Notes (Signed)
[facility] Emergency Department Provider Note ____________________________________________  Time seen: 1040  I have reviewed the triage vital signs and the nursing notes.  HISTORY  Chief Complaint  Joint Swelling and Elbow Pain   HPI Seth Brown is a 60 y.o. male presents himself to the ED for evaluation of 2 weeks of left elbow pain.  Patient describes he works out regularly, but is unclear if he injured himself recently.  He denies any distal paresthesias, grip changes, or difficulty range.  He localizes pain to the olecranon process of the left elbow.  Past Medical History:  Diagnosis Date   Hypertension     Patient Active Problem List   Diagnosis Date Noted   Melena 10/12/2018   Essential hypertension 04/10/2015   Chronic pain syndrome 09/12/2012   Chronic low back pain 09/12/2012   Chronic hepatitis C virus infection (HCC) 08/01/2012    Past Surgical History:  Procedure Laterality Date   BACK SURGERY     ESOPHAGOGASTRODUODENOSCOPY (EGD) WITH PROPOFOL N/A 10/13/2018   Procedure: ESOPHAGOGASTRODUODENOSCOPY (EGD) WITH PROPOFOL;  Surgeon: Midge Minium, MD;  Location: [facility] ENDOSCOPY;  Service: Endoscopy;  Laterality: N/A;    Prior to Admission medications   Medication Sig Start Date End Date Taking? Authorizing Provider  diclofenac (VOLTAREN) 50 MG EC tablet Take 1 tablet (50 mg total) by mouth 2 (two) times daily for 15 days. 11/21/20 12/06/20 Yes Jakhai Fant, Charlesetta Ivory, PA-C  amLODipine (NORVASC) 10 MG tablet Take 1 tablet (10 mg total) by mouth daily. 10/14/18   Shirley, [place], DO  benzonatate (TESSALON PERLES) 100 MG capsule Take 1 capsule (100 mg total) by mouth 3 (three) times daily as needed. 11/26/19 11/25/20  Enid Derry, PA-C  brimonidine (ALPHAGAN) 0.2 % ophthalmic solution Place 1 drop into both eyes 3 (three) times daily.    [provider]  cyclobenzaprine (FLEXERIL) 5 MG tablet Take 1-2 tablets 3 times daily as needed  03/03/20   Enid Derry, PA-C  fluticasone [facility]) 50 MCG/ACT nasal spray Place 2 sprays into both nostrils daily. 11/26/19 11/25/20  Enid Derry, PA-C  ibuprofen (ADVIL) 600 MG tablet Take 1 tablet (600 mg total) by mouth every 6 (six) hours as needed. 03/03/20   Enid Derry, PA-C  ipratropium (ATROVENT) 0.06 % nasal spray Place 2 sprays into both nostrils 4 (four) times daily as needed for rhinitis. Patient not taking: Reported on 10/12/2018 04/21/18   Tommie Sams, DO  oxyCODONE (ROXICODONE) 15 MG immediate release tablet Take 15 mg by mouth 3 (three) times daily.    [provider]    Allergies Amoxicillin  Family History  Problem Relation Age of Onset   CAD Mother    COPD Father     Social History Social History   Tobacco Use   Smoking status: Never   Smokeless tobacco: Never  Vaping Use   Vaping Use: Never used  Substance Use Topics   Alcohol use: No   Drug use: No    Review of Systems  Constitutional: Negative for fever. Eyes: Negative for visual changes. ENT: Negative for sore throat. Cardiovascular: Negative for chest pain. Respiratory: Negative for shortness of breath. Gastrointestinal: Negative for abdominal pain, vomiting and diarrhea. Genitourinary: Negative for dysuria. Musculoskeletal: Negative for back pain.  Left elbow pain as above. Skin: Negative for rash. Neurological: Negative for headaches, focal weakness or numbness. ____________________________________________  PHYSICAL EXAM:  VITAL SIGNS: ED Triage Vitals  Enc Vitals Group     BP 11/21/20 0933 129/74  Pulse Rate 11/21/20 0933 62     Resp 11/21/20 0933 16     Temp 11/21/20 0933 98 F (36.7 C)     Temp Source 11/21/20 0933 Oral     SpO2 11/21/20 0933 96 %     Weight 11/21/20 0934 205 lb (93 kg)     Height 11/21/20 0934 5\' 10"  (1.778 m)     Head Circumference --      Peak Flow --      Pain Score 11/21/20 0934 10     Pain Loc --      Pain Edu? --      Excl. in GC?  --     Constitutional: Alert and oriented. Well appearing and in no distress. Head: Normocephalic and atraumatic. Eyes: Conjunctivae are normal.  Normal extraocular movements Cardiovascular: Normal rate, regular rhythm. Normal distal pulses. Respiratory: Normal respiratory effort. Musculoskeletal: Left elbow without obvious deformity, dislocation, or joint effusion.  Patient with normal active range of motion of the elbow.  Mildly tender to palpation to the lateral epicondyles, and the olecranon process.  Normal grip strength distally.  Nontender with normal range of motion in all extremities.  Neurologic: Cranial nerves II to XII grossly intact.  Normal gait without ataxia. Normal speech and language. No gross focal neurologic deficits are appreciated. Skin:  Skin is warm, dry and intact. No rash noted. Psychiatric: Mood and affect are normal. Patient exhibits appropriate insight and judgment. ____________________________________________    {LABS (pertinent positives/negatives)  ____________________________________________  {EKG  ____________________________________________   RADIOLOGY Official radiology report(s): DG Elbow Complete Left  Result Date: 11/21/2020 CLINICAL DATA:  Atraumatic left elbow pain for 2-3 weeks EXAM: LEFT ELBOW - COMPLETE 3+ VIEW COMPARISON:  None. FINDINGS: Fragmented olecranon enthesophyte with chronic/corticated margins. No evidence of fracture, dislocation, or joint effusion. Minor marginal spurring. IMPRESSION: 1. No acute finding. 2. Chronically fragmented olecranon enthesophyte, question extensor symptoms. Electronically Signed   By: 11/23/2020 M.D.   On: 11/21/2020 10:18   ____________________________________________  PROCEDURES   Procedures ____________________________________________   INITIAL IMPRESSION / ASSESSMENT AND PLAN / ED COURSE  As part of my medical decision making, I reviewed the following data within the electronic medical  record:  Radiograph reviewed as above, Notes from prior ED visits, and [place] Controlled Substance Database    DDX: epicondylitis, bursitis, OA  Patient ED evaluation of acute left elbow pain, without preceding injury, trauma, fall.  Patient was evaluated for his complaints in the ED, and found to have a Chronic fragment olecranon enthesophyte.  No clinical evidence of any acute epicondylitis.  Without any acute neuromuscular deficit, will be discharged to follow-up with orthopedics for ongoing management of his complaints.  A prescription diclofenac provided for his benefit.  Return precautions been reviewed.    Seth Brown was evaluated in Emergency Department on 11/21/2020 for the symptoms described in the history of present illness. He was evaluated in the context of the global COVID-19 pandemic, which necessitated consideration that the patient might be at risk for infection with the SARS-CoV-2 virus that causes COVID-19. Institutional protocols and algorithms that pertain to the evaluation of patients at risk for COVID-19 are in a state of rapid change based on information released by regulatory bodies including the CDC and federal and state organizations. These policies and algorithms were followed during the patient's care in the ED. ____________________________________________  FINAL CLINICAL IMPRESSION(S) / ED DIAGNOSES  Final diagnoses:  Elbow pain, chronic, left      Rayfield Beem,  Charlesetta Ivory, PA-C 11/21/20 1605    Shaune Pollack, MD 11/24/20 [phone]

## 2020-11-21 NOTE — ED Triage Notes (Signed)
Pt states that for the past 2 weeks he has had left elbow pain, states that while working out he is uncertain if he injured denies any known injury

## 2020-12-05 ENCOUNTER — Other Ambulatory Visit: Payer: Self-pay

## 2020-12-05 ENCOUNTER — Ambulatory Visit
Admission: EM | Admit: 2020-12-05 | Discharge: 2020-12-05 | Disposition: A | Discharge status: Home / Self Care | Payer: Medicaid Other | Attending: Emergency Medicine | Admitting: Emergency Medicine

## 2020-12-05 DIAGNOSIS — H6981 Other specified disorders of Eustachian tube, right ear: Secondary | ICD-10-CM

## 2020-12-05 MED ORDER — FLUTICASONE PROPIONATE 50 MCG/ACT NA SUSP: 2.0000 | Freq: Every day | NASAL | 1 refills | Status: DC

## 2020-12-05 MED ORDER — IPRATROPIUM BROMIDE 0.06 % NA SOLN: 2.0000 | Freq: Four times a day (QID) | NASAL | 12 refills | Status: DC

## 2020-12-05 NOTE — ED Provider Notes (Signed)
[facility]    CSN: [id number] Arrival date & time: 12/05/20  3825      History   Chief Complaint Chief Complaint  Patient presents with   Ear Pain    right    HPI Seth Brown is a 60 y.o. male.   HPI  61 year old male here for evaluation of right ear pain and right-sided throat pain.  Patient reports that he has had the above symptoms for the past 10 days.  He states that it hurts to swallow and he is also endorsing a nonproductive cough.  He denies any runny nose or nasal congestion, fever, changes to his hearing, ringing in his ears, dizziness or balance issues.  Past Medical History:  Diagnosis Date   Hypertension     Patient Active Problem List   Diagnosis Date Noted   Melena 10/12/2018   Essential hypertension 04/10/2015   Chronic pain syndrome 09/12/2012   Chronic low back pain 09/12/2012   Chronic hepatitis C virus infection (HCC) 08/01/2012    Past Surgical History:  Procedure Laterality Date   BACK SURGERY     ESOPHAGOGASTRODUODENOSCOPY (EGD) WITH PROPOFOL N/A 10/13/2018   Procedure: ESOPHAGOGASTRODUODENOSCOPY (EGD) WITH PROPOFOL;  Surgeon: Midge Minium, MD;  Location: [facility] ENDOSCOPY;  Service: Endoscopy;  Laterality: N/A;       Home Medications    Prior to Admission medications   Medication Sig Start Date End Date Taking? Authorizing Provider  amLODipine (NORVASC) 10 MG tablet Take 1 tablet (10 mg total) by mouth daily. 10/14/18  Yes Talbert Forest, [place], DO  brimonidine (ALPHAGAN) 0.2 % ophthalmic solution Place 1 drop into both eyes 3 (three) times daily.   Yes [provider]  fluticasone (FLONASE) 50 MCG/ACT nasal spray Place 2 sprays into both nostrils daily. 12/05/20  Yes Becky Augusta, NP  ipratropium (ATROVENT) 0.06 % nasal spray Place 2 sprays into both nostrils 4 (four) times daily. 12/05/20  Yes Becky Augusta, NP  oxyCODONE (ROXICODONE) 15 MG immediate release tablet Take 15 mg by mouth 3 (three) times daily.   Yes  [provider]    Family History Family History  Problem Relation Age of Onset   CAD Mother    COPD Father     Social History Social History   Tobacco Use   Smoking status: Never   Smokeless tobacco: Never  Vaping Use   Vaping Use: Never used  Substance Use Topics   Alcohol use: No   Drug use: No     Allergies   Amoxicillin   Review of Systems Review of Systems  Constitutional:  Negative for activity change, appetite change and fever.  HENT:  Positive for ear pain and sore throat. Negative for congestion, ear discharge and rhinorrhea.   Respiratory:  Positive for cough. Negative for shortness of breath and wheezing.   Neurological:  Negative for dizziness.  Hematological: Negative.   Psychiatric/Behavioral: Negative.      Physical Exam Triage Vital Signs ED Triage Vitals  Enc Vitals Group     BP 12/05/20 0916 (!) 157/90     Pulse Rate 12/05/20 0916 68     Resp 12/05/20 0916 18     Temp 12/05/20 0916 98.6 F (37 C)     Temp Source 12/05/20 0916 Oral     SpO2 12/05/20 0916 95 %     Weight 12/05/20 0914 205 lb (93 kg)     Height 12/05/20 0914 5\' 10"  (1.778 m)     Head Circumference --  Peak Flow --      Pain Score 12/05/20 0914 9     Pain Loc --      Pain Edu? --      Excl. in GC? --    No data found.  Updated Vital Signs BP (!) 157/90 (BP Location: Right Arm)   Pulse 68   Temp 98.6 F (37 C) (Oral)   Resp 18   Ht 5\' 10"  (1.778 m)   Wt 205 lb (93 kg)   SpO2 95%   BMI 29.41 kg/m   Visual Acuity Right Eye Distance:   Left Eye Distance:   Bilateral Distance:    Right Eye Near:   Left Eye Near:    Bilateral Near:     Physical Exam Vitals and nursing note reviewed.  Constitutional:      General: He is not in acute distress.    Appearance: Normal appearance. He is not ill-appearing.  HENT:     Head: Normocephalic and atraumatic.     Right Ear: Tympanic membrane, ear canal and external ear normal. There is no impacted  cerumen.     Left Ear: Tympanic membrane, ear canal and external ear normal. There is no impacted cerumen.     Mouth/Throat:     Mouth: Mucous membranes are moist.     Pharynx: Oropharynx is clear. Posterior oropharyngeal erythema present. No oropharyngeal exudate.  Cardiovascular:     Rate and Rhythm: Normal rate and regular rhythm.     Pulses: Normal pulses.     Heart sounds: Normal heart sounds. No murmur heard.   No gallop.  Pulmonary:     Effort: Pulmonary effort is normal.     Breath sounds: Normal breath sounds. No wheezing, rhonchi or rales.  Musculoskeletal:     Cervical back: Normal range of motion and neck supple.  Lymphadenopathy:     Cervical: No cervical adenopathy.  Skin:    General: Skin is warm and dry.     Capillary Refill: Capillary refill takes less than 2 seconds.     Findings: No erythema or rash.  Neurological:     General: No focal deficit present.     Mental Status: He is alert and oriented to person, place, and time.  Psychiatric:        Mood and Affect: Mood normal.        Behavior: Behavior normal.        Thought Content: Thought content normal.        Judgment: Judgment normal.     UC Treatments / Results  Labs (all labs ordered are listed, but only abnormal results are displayed) Labs Reviewed - No data to display  EKG   Radiology No results found.  Procedures Procedures (including critical care time)  Medications Ordered in UC Medications - No data to display  Initial Impression / Assessment and Plan / UC Course  I have reviewed the triage vital signs and the nursing notes.  Pertinent labs & imaging results that were available during my care of the patient were reviewed by me and considered in my medical decision making (see chart for details).  Patient is a very pleasant, nontoxic-appearing 60 year old male here for evaluation of right ear pain and right-sided sore throat that been present for last 2 days as outlined HPI above.   Patient's physical exam reveals pearly gray tympanic membranes bilaterally with a normal light reflex and clear external auditory canals.  There is pain with palpation of the eustachian tube on  the right when palpating externally.  Oropharyngeal exam reveals posterior oropharyngeal erythema and cobblestoning with clear postnasal drip no cervical lymphadenopathy appreciated on exam.  Cardiopulmonary exam reveals clear lung sounds in all fields.  Patient's exam is consistent with eustachian tube dysfunction and will treat with Atrovent nasal spray and Flonase.  Patient also encouraged to equalizes ears to try and clear the blockage from his eustachian tube.  Return precautions reviewed with patient.   Final Clinical Impressions(s) / UC Diagnoses   Final diagnoses:  Eustachian tube dysfunction, right     Discharge Instructions      Use the Atrovent nasal spray, 2 squirts in each nostril every 6 hours, to help with nasal congestion.  Take over-the-counter Zyrtec, Claritin, or Allegra once daily to help with allergic symptoms.  Instill 2 squirts of fluticasone in each nostril at bedtime nightly.  And the nasal away from the septum of your nose and follow each set of squirts with 1 squirt of nasal saline to push the particles up into your turbinates where they will take effect.  Continue to equalize your ears as shown to help clear mucus from eustachian tubes and maintain patency.       ED Prescriptions     Medication Sig Dispense Auth. Provider   fluticasone (FLONASE) 50 MCG/ACT nasal spray Place 2 sprays into both nostrils daily. 18.2 mL Becky Augusta, NP   ipratropium (ATROVENT) 0.06 % nasal spray Place 2 sprays into both nostrils 4 (four) times daily. 15 mL Becky Augusta, NP      PDMP not reviewed this encounter.   Becky Augusta, NP 12/05/20 1003

## 2020-12-05 NOTE — ED Triage Notes (Signed)
Patient complains of right ear pain and right side throat pain x 10 days.

## 2020-12-05 NOTE — Discharge Instructions (Addendum)
Use the Atrovent nasal spray, 2 squirts in each nostril every 6 hours, to help with nasal congestion.  Take over-the-counter Zyrtec, Claritin, or Allegra once daily to help with allergic symptoms.  Instill 2 squirts of fluticasone in each nostril at bedtime nightly.  And the nasal away from the septum of your nose and follow each set of squirts with 1 squirt of nasal saline to push the particles up into your turbinates where they will take effect.  Continue to equalize your ears as shown to help clear mucus from eustachian tubes and maintain patency.   

## 2021-01-20 ENCOUNTER — Other Ambulatory Visit: Payer: Self-pay

## 2021-01-20 ENCOUNTER — Emergency Department: Payer: Medicaid Other

## 2021-01-20 ENCOUNTER — Emergency Department
Admission: EM | Admit: 2021-01-20 | Discharge: 2021-01-20 | Disposition: A | Discharge status: Home / Self Care | Payer: Medicaid Other | Attending: Student in an Organized Health Care Education/Training Program | Admitting: Student in an Organized Health Care Education/Training Program

## 2021-01-20 DIAGNOSIS — Z79899 Other long term (current) drug therapy: Secondary | ICD-10-CM | POA: Insufficient documentation

## 2021-01-20 DIAGNOSIS — S99922A Unspecified injury of left foot, initial encounter: Secondary | ICD-10-CM | POA: Diagnosis present

## 2021-01-20 DIAGNOSIS — X501XXA Overexertion from prolonged static or awkward postures, initial encounter: Secondary | ICD-10-CM | POA: Insufficient documentation

## 2021-01-20 DIAGNOSIS — I10 Essential (primary) hypertension: Secondary | ICD-10-CM | POA: Insufficient documentation

## 2021-01-20 DIAGNOSIS — S93602A Unspecified sprain of left foot, initial encounter: Secondary | ICD-10-CM | POA: Diagnosis not present

## 2021-01-20 MED ORDER — MELOXICAM 15 MG PO TABS: 15.0000 mg | ORAL_TABLET | Freq: Every day | ORAL | 2 refills | Status: DC

## 2021-01-20 NOTE — ED Triage Notes (Signed)
Pt reports that he stepped off a curb yesterday and reports that he rolled his left foot outward, pt states that he has been having pain to walk ever since
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
## 2021-01-20 NOTE — ED Provider Notes (Signed)
[facility] Emergency Department Provider Note  ____________________________________________   Event Date/Time   First MD Initiated Contact with Patient 01/20/21 [phone]     (approximate)  I have reviewed the triage vital signs and the nursing notes.   HISTORY  Chief Complaint Foot Pain    HPI Seth Brown is a 60 y.o. male presents emergency department complaint of left foot pain.  Patient states he rolled his ankle yesterday has had increased pain in the left foot.  No other injuries reported.  No numbness or tingling.  Past Medical History:  Diagnosis Date   Hypertension     Patient Active Problem List   Diagnosis Date Noted   Melena 10/12/2018   Essential hypertension 04/10/2015   Chronic pain syndrome 09/12/2012   Chronic low back pain 09/12/2012   Chronic hepatitis C virus infection (HCC) 08/01/2012    Past Surgical History:  Procedure Laterality Date   BACK SURGERY     ESOPHAGOGASTRODUODENOSCOPY (EGD) WITH PROPOFOL N/A 10/13/2018   Procedure: ESOPHAGOGASTRODUODENOSCOPY (EGD) WITH PROPOFOL;  Surgeon: Seth Minium, MD;  Location: [facility] ENDOSCOPY;  Service: Endoscopy;  Laterality: N/A;    Prior to Admission medications   Medication Sig Start Date End Date Taking? Authorizing Provider  meloxicam (MOBIC) 15 MG tablet Take 1 tablet (15 mg total) by mouth daily. 01/20/21 01/20/22 Yes Eyad Rochford, Roselyn Bering, PA-C  amLODipine (NORVASC) 10 MG tablet Take 1 tablet (10 mg total) by mouth daily. 10/14/18   Shirley, [place], DO  brimonidine (ALPHAGAN) 0.2 % ophthalmic solution Place 1 drop into both eyes 3 (three) times daily.    [provider]  fluticasone (FLONASE) 50 MCG/ACT nasal spray Place 2 sprays into both nostrils daily. 12/05/20   Seth Augusta, NP  ipratropium (ATROVENT) 0.06 % nasal spray Place 2 sprays into both nostrils 4 (four) times daily. 12/05/20   Seth Augusta, NP  oxyCODONE (ROXICODONE) 15 MG immediate release tablet Take 15 mg by  mouth 3 (three) times daily.    [provider]    Allergies Amoxicillin  Family History  Problem Relation Age of Onset   CAD Mother    COPD Father     Social History Social History   Tobacco Use   Smoking status: Never   Smokeless tobacco: Never  Vaping Use   Vaping Use: Never used  Substance Use Topics   Alcohol use: No   Drug use: No    Review of Systems  Constitutional: No fever/chills Eyes: No visual changes. ENT: No sore throat. Respiratory: Denies cough Cardiovascular: Denies chest pain Gastrointestinal: Denies abdominal pain Genitourinary: Negative for dysuria. Musculoskeletal: Negative for back pain.  Positive for left foot pain Skin: Negative for rash. Psychiatric: no mood changes,     ____________________________________________   PHYSICAL EXAM:  VITAL SIGNS: ED Triage Vitals  Enc Vitals Group     BP 01/20/21 0808 (!) 127/98     Pulse Rate 01/20/21 0808 60     Resp 01/20/21 0808 16     Temp 01/20/21 0808 98.1 F (36.7 C)     Temp Source 01/20/21 0808 Oral     SpO2 01/20/21 0808 96 %     Weight 01/20/21 0809 200 lb (90.7 kg)     Height 01/20/21 0809 5\' 10"  (1.778 m)     Head Circumference --      Peak Flow --      Pain Score 01/20/21 0809 9     Pain Loc --  Pain Edu? --      Excl. in GC? --     Constitutional: Alert and oriented. Well appearing and in no acute distress. Eyes: Conjunctivae are normal.  Head: Atraumatic. Nose: No congestion/rhinnorhea. Mouth/Throat: Mucous membranes are moist.   Neck:  supple no lymphadenopathy noted Cardiovascular: Normal rate, regular rhythm.  Respiratory: Normal respiratory effort.  No retractions, GU: deferred Musculoskeletal: FROM all extremities, warm and well perfused, left foot is tender to palpation Neurologic:  Normal speech and language.  Skin:  Skin is warm, dry and intact. No rash noted. Psychiatric: Mood and affect are normal. Speech and behavior are  normal.  ____________________________________________   LABS (all labs ordered are listed, but only abnormal results are displayed)  Labs Reviewed - No data to display ____________________________________________   ____________________________________________  RADIOLOGY  X-ray of the left foot  ____________________________________________   PROCEDURES  Procedure(s) performed: Postop shoe applied by nursing staff  Procedures    ____________________________________________   INITIAL IMPRESSION / ASSESSMENT AND PLAN / ED COURSE  Pertinent labs & imaging results that were available during my care of the patient were reviewed by me and considered in my medical decision making (see chart for details).   Patient 60 year old male presents for left foot pain.  See HPI.  Physical exam shows the left foot to be tender along with fifth metatarsal.  X-ray of the left foot to rule out fracture  X-ray left foot reviewed by me confirmed by radiology to be negative for fracture  Patient was placed in a postop shoe for comfort.  Given a prescription for meloxicam.  He is to elevate and ice.  Follow-up with orthopedics if not improving in 1 week.  Discharged in stable condition.  Seth Brown was evaluated in Emergency Department on 01/20/2021 for the symptoms described in the history of present illness. He was evaluated in the context of the global COVID-19 pandemic, which necessitated consideration that the patient might be at risk for infection with the SARS-CoV-2 virus that causes COVID-19. Institutional protocols and algorithms that pertain to the evaluation of patients at risk for COVID-19 are in a state of rapid change based on information released by regulatory bodies including the CDC and federal and state organizations. These policies and algorithms were followed during the patient's care in the ED.    As part of my medical decision making, I reviewed the following data  within the electronic MEDICAL RECORD NUMBER Nursing notes reviewed and incorporated, Old chart reviewed, Radiograph reviewed , Notes from prior ED visits, and [place] Controlled Substance Database  ____________________________________________   FINAL CLINICAL IMPRESSION(S) / ED DIAGNOSES  Final diagnoses:  Sprain of left foot, initial encounter      NEW MEDICATIONS STARTED DURING THIS VISIT:  New Prescriptions   MELOXICAM (MOBIC) 15 MG TABLET    Take 1 tablet (15 mg total) by mouth daily.     Note:  This document was prepared using Dragon voice recognition software and may include unintentional dictation errors.    Faythe Ghee, PA-C 01/20/21 9509    Willy Eddy, MD 01/20/21 [phone]

## 2021-05-01 ENCOUNTER — Ambulatory Visit
Admission: EM | Admit: 2021-05-01 | Discharge: 2021-05-01 | Disposition: A | Discharge status: Home / Self Care | Payer: Medicaid Other | Attending: Internal Medicine | Admitting: Internal Medicine

## 2021-05-01 ENCOUNTER — Other Ambulatory Visit: Payer: Self-pay

## 2021-05-01 ENCOUNTER — Encounter: Payer: Self-pay | Admitting: Emergency Medicine

## 2021-05-01 DIAGNOSIS — J209 Acute bronchitis, unspecified: Secondary | ICD-10-CM | POA: Diagnosis not present

## 2021-05-01 MED ORDER — BENZONATATE 100 MG PO CAPS: 200.0000 mg | ORAL_CAPSULE | Freq: Three times a day (TID) | ORAL | 0 refills | Status: DC

## 2021-05-01 MED ORDER — AZITHROMYCIN 250 MG PO TABS: ORAL_TABLET | ORAL | 0 refills | Status: DC

## 2021-05-01 MED ORDER — BENZONATATE 200 MG PO CAPS: 200.0000 mg | ORAL_CAPSULE | Freq: Two times a day (BID) | ORAL | 0 refills | Status: DC | PRN

## 2021-05-01 NOTE — ED Triage Notes (Signed)
Pt c/o nasal congestion, cough, body aches. Started about 3 weeks ago.  Denies fever.

## 2021-05-01 NOTE — ED Provider Notes (Signed)
[facility]    CSN: [id number] Arrival date & time: 05/01/21  0737      History   Chief Complaint Chief Complaint  Patient presents with   Nasal Congestion   Cough    HPI Seth Brown is a 61 y.o. male who presents with hx of URI x 3 weeks and 2 weeks ago developed body aches and has continued. His cough is productive with green mucous. Denies SOB, wheezing or CP or sweats. Feels fatigued. Appetite has not changed.     Past Medical History:  Diagnosis Date   Hypertension     Patient Active Problem List   Diagnosis Date Noted   Melena 10/12/2018   Essential hypertension 04/10/2015   Chronic pain syndrome 09/12/2012   Chronic low back pain 09/12/2012   Chronic hepatitis C virus infection (HCC) 08/01/2012    Past Surgical History:  Procedure Laterality Date   BACK SURGERY     ESOPHAGOGASTRODUODENOSCOPY (EGD) WITH PROPOFOL N/A 10/13/2018   Procedure: ESOPHAGOGASTRODUODENOSCOPY (EGD) WITH PROPOFOL;  Surgeon: Midge Minium, MD;  Location: [facility] ENDOSCOPY;  Service: Endoscopy;  Laterality: N/A;       Home Medications    Prior to Admission medications   Medication Sig Start Date End Date Taking? Authorizing Provider  amLODipine (NORVASC) 10 MG tablet Take 1 tablet (10 mg total) by mouth daily. 10/14/18  Yes Talbert Forest, [place], DO  azithromycin (ZITHROMAX Z-PAK) 250 MG tablet 2 today then one qd x 4 days 05/01/21  Yes Rodriguez-Southworth, Nettie Elm, PA-C  benzonatate (TESSALON) 200 MG capsule Take 1 capsule (200 mg total) by mouth 2 (two) times daily as needed for cough. 05/01/21  Yes Rodriguez-Southworth, Nettie Elm, PA-C  oxyCODONE (ROXICODONE) 15 MG immediate release tablet Take 15 mg by mouth 3 (three) times daily.   Yes [provider]    Family History Family History  Problem Relation Age of Onset   CAD Mother    COPD Father     Social History Social History   Tobacco Use   Smoking status: Never   Smokeless tobacco: Never  Vaping Use    Vaping Use: Never used  Substance Use Topics   Alcohol use: No   Drug use: No     Allergies   Amoxicillin   Review of Systems Review of Systems  Constitutional:  Positive for fatigue. Negative for activity change, appetite change, chills, diaphoresis and fever.  HENT:  Positive for congestion and postnasal drip. Negative for ear discharge, ear pain, rhinorrhea and sore throat.   Eyes:  Negative for discharge.  Respiratory:  Positive for cough. Negative for chest tightness, shortness of breath and wheezing.   Cardiovascular:  Negative for chest pain.  Musculoskeletal:  Positive for myalgias.  Skin:  Negative for rash.  Neurological:  Negative for headaches.    Physical Exam Triage Vital Signs ED Triage Vitals  Enc Vitals Group     BP 05/01/21 0905 (!) 143/99     Pulse Rate 05/01/21 0905 66     Resp 05/01/21 0905 18     Temp 05/01/21 0905 98 F (36.7 C)     Temp Source 05/01/21 0905 Oral     SpO2 05/01/21 0905 99 %     Weight 05/01/21 0904 199 lb 15.3 oz (90.7 kg)     Height 05/01/21 0904 5\' 10"  (1.778 m)     Head Circumference --      Peak Flow --      Pain Score 05/01/21 0903 8  Pain Loc --      Pain Edu? --      Excl. in GC? --    No data found.  Updated Vital Signs BP (!) 143/99 (BP Location: Left Arm)    Pulse 66    Temp 98 F (36.7 C) (Oral)    Resp 18    Ht 5\' 10"  (1.778 m)    Wt 199 lb 15.3 oz (90.7 kg)    SpO2 99%    BMI 28.69 kg/m   Visual Acuity Right Eye Distance:   Left Eye Distance:   Bilateral Distance:    Right Eye Near:   Left Eye Near:    Bilateral Near:     Physical Exam Physical Exam Constitutional:      General: He is not in acute distress.    Appearance: He is not toxic-appearing.  HENT:     Head: Normocephalic.     Right Ear: Tympanic membrane, ear canal and external ear normal.     Left Ear: Ear canal and external ear normal.     Nose: Nose normal.     Mouth/Throat:     Mouth: Mucous membranes are moist.     Pharynx:  Oropharynx is clear.  Eyes:     General: No scleral icterus.    Conjunctiva/sclera: Conjunctivae normal.  Cardiovascular:     Rate and Rhythm: Normal rate and regular rhythm.     Heart sounds: No murmur heard.   Pulmonary:     Effort: Pulmonary effort is normal. No respiratory distress.     Breath sounds: clear    Musculoskeletal:        General: Normal range of motion.     Cervical back: Neck supple.  Lymphadenopathy:     Cervical: No cervical adenopathy.  Skin:    General: Skin is warm and dry.     Findings: No rash.  Neurological:     Mental Status: He is alert and oriented to person, place, and time.     Gait: Gait normal.  Psychiatric:        Mood and Affect: Mood normal.        Behavior: Behavior normal.        Thought Content: Thought content normal.        Judgment: Judgment normal.    UC Treatments / Results  Labs (all labs ordered are listed, but only abnormal results are displayed) Labs Reviewed - No data to display  EKG   Radiology No results found.  Procedures Procedures (including critical care time)  Medications Ordered in UC Medications - No data to display  Initial Impression / Assessment and Plan / UC Course  I have reviewed the triage vital signs and the nursing notes. Acute bronchitis. I placed him on Zpack and Tessalon as noted.  Final Clinical Impressions(s) / UC Diagnoses   Final diagnoses:  Acute bronchitis, unspecified organism   Discharge Instructions   None    ED Prescriptions     Medication Sig Dispense Auth. Provider   azithromycin (ZITHROMAX Z-PAK) 250 MG tablet 2 today then one qd x 4 days 6 tablet Rodriguez-Southworth, Dempsey Ahonen, PA-C   benzonatate (TESSALON) 200 MG capsule Take 1 capsule (200 mg total) by mouth 2 (two) times daily as needed for cough. 30 capsule Rodriguez-Southworth, , PA-C      PDMP not reviewed this encounter.   Nettie Elm, Garey Ham 05/01/21 [phone]

## 2022-03-02 ENCOUNTER — Emergency Department
Admission: EM | Admit: 2022-03-02 | Discharge: 2022-03-02 | Disposition: A | Discharge status: Home / Self Care | Payer: Medicaid Other | Attending: Emergency Medicine | Admitting: Emergency Medicine

## 2022-03-02 ENCOUNTER — Encounter: Payer: Self-pay | Admitting: Emergency Medicine

## 2022-03-02 ENCOUNTER — Emergency Department: Payer: Medicaid Other

## 2022-03-02 DIAGNOSIS — S39012A Strain of muscle, fascia and tendon of lower back, initial encounter: Secondary | ICD-10-CM | POA: Insufficient documentation

## 2022-03-02 DIAGNOSIS — M542 Cervicalgia: Secondary | ICD-10-CM | POA: Diagnosis not present

## 2022-03-02 DIAGNOSIS — Y92410 Unspecified street and highway as the place of occurrence of the external cause: Secondary | ICD-10-CM | POA: Diagnosis not present

## 2022-03-02 DIAGNOSIS — S3992XA Unspecified injury of lower back, initial encounter: Secondary | ICD-10-CM | POA: Diagnosis present

## 2022-03-02 MED ORDER — METHOCARBAMOL 500 MG PO TABS: 500.0000 mg | ORAL_TABLET | Freq: Four times a day (QID) | ORAL | 0 refills | Status: AC

## 2022-03-02 MED ORDER — MELOXICAM 15 MG PO TABS: 15.0000 mg | ORAL_TABLET | Freq: Every day | ORAL | 0 refills | Status: AC
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
## 2022-03-04 ENCOUNTER — Emergency Department
Admission: EM | Admit: 2022-03-04 | Discharge: 2022-03-04 | Disposition: A | Discharge status: Home / Self Care | Payer: Medicaid Other | Attending: Emergency Medicine | Admitting: Emergency Medicine

## 2022-03-04 ENCOUNTER — Other Ambulatory Visit: Payer: Self-pay

## 2022-03-04 DIAGNOSIS — I10 Essential (primary) hypertension: Secondary | ICD-10-CM | POA: Diagnosis not present

## 2022-03-04 DIAGNOSIS — Y92410 Unspecified street and highway as the place of occurrence of the external cause: Secondary | ICD-10-CM | POA: Insufficient documentation

## 2022-03-04 DIAGNOSIS — M25512 Pain in left shoulder: Secondary | ICD-10-CM | POA: Diagnosis present
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
## 2022-03-06 ENCOUNTER — Ambulatory Visit (INDEPENDENT_AMBULATORY_CARE_PROVIDER_SITE_OTHER): Payer: Medicaid Other | Admitting: Orthopedic Surgery

## 2022-03-06 ENCOUNTER — Other Ambulatory Visit: Payer: Self-pay

## 2022-03-06 ENCOUNTER — Encounter: Payer: Self-pay | Admitting: Orthopedic Surgery

## 2022-03-06 ENCOUNTER — Ambulatory Visit
Admission: RE | Admit: 2022-03-06 | Discharge: 2022-03-06 | Disposition: A | Discharge status: Home / Self Care | Payer: Self-pay | Source: Ambulatory Visit | Attending: Orthopedic Surgery | Admitting: Orthopedic Surgery

## 2022-03-06 VITALS — BP 155/88 | HR 75 | Ht 70.0 in | Wt 203.6 lb

## 2022-03-06 DIAGNOSIS — M47812 Spondylosis without myelopathy or radiculopathy, cervical region: Secondary | ICD-10-CM

## 2022-03-06 DIAGNOSIS — R29898 Other symptoms and signs involving the musculoskeletal system: Secondary | ICD-10-CM | POA: Diagnosis not present

## 2022-03-06 DIAGNOSIS — M4722 Other spondylosis with radiculopathy, cervical region: Secondary | ICD-10-CM

## 2022-03-06 DIAGNOSIS — M5412 Radiculopathy, cervical region: Secondary | ICD-10-CM

## 2022-03-06 DIAGNOSIS — M25512 Pain in left shoulder: Secondary | ICD-10-CM | POA: Diagnosis not present

## 2022-03-06 DIAGNOSIS — Z049 Encounter for examination and observation for unspecified reason: Secondary | ICD-10-CM

## 2022-03-06 MED ORDER — METHYLPREDNISOLONE 4 MG PO TBPK: ORAL_TABLET | ORAL | 0 refills | Status: AC

## 2022-03-06 NOTE — Patient Instructions (Signed)
It was so nice to see you today, I am sorry that you are hurting so much.   Your have some wear and tear in your neck. I want to get an MRI of the neck to look into things further.   Will call you about getting OPEN MRI at [facility] in [place]. Please call and let us know when MRI is done so we can set up a follow up.   I sent a prescription for a medrol dose pack to help with pain and inflammation. Take as directed.   I will see you back after the MRI. Please do not hesitate to call if you have any questions or concerns. You can also message me in MyChart.   If you have not heard back about any of the tests/procedures in the next week, please call the office so we can help you get these things scheduled.   Drake Leach PA-C [phone]

## 2022-03-16 ENCOUNTER — Telehealth: Payer: Self-pay

## 2022-03-16 DIAGNOSIS — M47812 Spondylosis without myelopathy or radiculopathy, cervical region: Secondary | ICD-10-CM

## 2022-03-16 DIAGNOSIS — M5412 Radiculopathy, cervical region: Secondary | ICD-10-CM

## 2022-03-16 DIAGNOSIS — R29898 Other symptoms and signs involving the musculoskeletal system: Secondary | ICD-10-CM

## 2022-03-16 MED ORDER — DIAZEPAM 5 MG PO TABS: ORAL_TABLET | ORAL | 0 refills | Status: AC
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
## 2022-03-16 NOTE — Telephone Encounter (Signed)
Valium sent for MRI. PMP reviewed and is appropriate- he is on chronic oxycodone.   Let him know I sent to his pharmacy. Remind him that he will need a driver. Let us know when MRI is done so we can get results.

## 2022-03-16 NOTE — Telephone Encounter (Signed)
Patient notified of valium refill and to take a driver.

## 2022-03-16 NOTE — Telephone Encounter (Signed)
-----   Message from Rockey Situ sent at 03/16/2022  8:22 AM EST ----- Regarding: medication Contact: [phone] He tried having his MRI done but could not do it because he is claustrophobic and they are going to reschedule him to another office with a bigger machine. In the mean time he was told to ask for valium for his next appt. [facility]

## 2022-03-16 NOTE — Telephone Encounter (Signed)
Did he go to [facility] OPEN MRI? Does he need valium to go back there? Please find out and let me know.

## 2022-03-16 NOTE — Addendum Note (Signed)
Addended byDrake Leach on: 03/16/2022 01:31 PM   Modules accepted: Orders

## 2022-03-31 ENCOUNTER — Ambulatory Visit
Admission: RE | Admit: 2022-03-31 | Discharge: 2022-03-31 | Disposition: A | Discharge status: Home / Self Care | Payer: Self-pay | Source: Ambulatory Visit | Attending: Orthopedic Surgery | Admitting: Orthopedic Surgery

## 2022-03-31 ENCOUNTER — Telehealth: Payer: Self-pay

## 2022-03-31 ENCOUNTER — Other Ambulatory Visit: Payer: Self-pay

## 2022-03-31 DIAGNOSIS — Z049 Encounter for examination and observation for unspecified reason: Secondary | ICD-10-CM

## 2022-03-31 NOTE — Telephone Encounter (Signed)
-----   Message from Rockey Situ sent at 03/31/2022  8:19 AM EST ----- Regarding: MRI results Contact: [phone] Patient had his MRI last week. Please call him with results. He is aware that [facility] is on vacation until next week.

## 2022-03-31 NOTE — Telephone Encounter (Signed)
[employer] request sent. Images are in [facility] now. Report is in Care Everywhere.

## 2022-04-07 ENCOUNTER — Ambulatory Visit: Payer: Medicaid Other | Admitting: Orthopedic Surgery

## 2022-04-07 ENCOUNTER — Encounter: Payer: Self-pay | Admitting: Orthopedic Surgery

## 2022-04-07 VITALS — BP 162/110 | HR 80 | Temp 98.6°F | Wt 201.2 lb

## 2022-04-07 DIAGNOSIS — M5412 Radiculopathy, cervical region: Secondary | ICD-10-CM

## 2022-04-07 DIAGNOSIS — M4722 Other spondylosis with radiculopathy, cervical region: Secondary | ICD-10-CM | POA: Diagnosis not present

## 2022-04-07 DIAGNOSIS — M47812 Spondylosis without myelopathy or radiculopathy, cervical region: Secondary | ICD-10-CM
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
## 2022-04-07 NOTE — Patient Instructions (Signed)
It was so nice to see you again today, I am sorry that you are hurting so much.   Your have some wear and tear in your neck with some disc bulges at C5-C6 and C6-C7. May be some irritation of nerves on left which could be causing your left arm pain.   I sent PT orders for your neck to [facility]. Call them at [phone].   Your blood pressure was elevated today, it was 162/110. I want you to recheck it at home and follow up with your PCP if it remains high. If you have any chest pain, shortness of breath, blurry vision, or headaches then you need to go to ED.    I will see you back in 6-8 weeks. Call me if you need anything.   Geronimo Boot PA-C [phone]

## 2022-04-07 NOTE — Telephone Encounter (Signed)
I added him for today at 1:30pm, he is anxious to get his results.

## 2022-04-07 NOTE — Telephone Encounter (Signed)
Please schedule him a follow up with me to review his MRI.
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
## 2022-06-02 ENCOUNTER — Encounter: Payer: Self-pay | Admitting: Orthopedic Surgery

## 2022-06-02 ENCOUNTER — Ambulatory Visit: Payer: Medicaid Other | Admitting: Orthopedic Surgery

## 2022-06-02 VITALS — BP 128/78 | HR 93 | Ht 71.0 in | Wt 206.6 lb

## 2022-06-02 DIAGNOSIS — M47812 Spondylosis without myelopathy or radiculopathy, cervical region: Secondary | ICD-10-CM

## 2022-06-02 DIAGNOSIS — M5030 Other cervical disc degeneration, unspecified cervical region: Secondary | ICD-10-CM | POA: Diagnosis not present

## 2022-07-13 ENCOUNTER — Other Ambulatory Visit: Payer: Self-pay | Admitting: Orthopedic Surgery

## 2022-07-16 ENCOUNTER — Encounter: Payer: Self-pay | Admitting: Orthopedic Surgery

## 2022-07-16 ENCOUNTER — Encounter: Payer: Self-pay | Admitting: Anesthesiology

## 2022-07-16 ENCOUNTER — Other Ambulatory Visit: Payer: Self-pay

## 2022-07-27 ENCOUNTER — Other Ambulatory Visit: Payer: Self-pay

## 2022-07-27 ENCOUNTER — Encounter: Payer: Self-pay | Admitting: Orthopedic Surgery

## 2022-07-27 ENCOUNTER — Encounter
Admission: RE | Disposition: A | Discharge status: Home / Self Care | Payer: Self-pay | Source: Home / Self Care | Attending: Orthopedic Surgery

## 2022-07-27 ENCOUNTER — Ambulatory Visit
Admission: RE | Admit: 2022-07-27 | Discharge: 2022-07-27 | Disposition: A | Discharge status: Home / Self Care | Payer: Medicaid Other | Attending: Orthopedic Surgery | Admitting: Orthopedic Surgery

## 2022-07-27 ENCOUNTER — Other Ambulatory Visit: Payer: Self-pay | Admitting: Orthopedic Surgery

## 2022-07-27 DIAGNOSIS — M7502 Adhesive capsulitis of left shoulder: Secondary | ICD-10-CM | POA: Insufficient documentation

## 2022-07-27 DIAGNOSIS — Z5329 Procedure and treatment not carried out because of patient's decision for other reasons: Secondary | ICD-10-CM | POA: Diagnosis not present

## 2022-07-27 HISTORY — DX: Hereditary and idiopathic neuropathy, unspecified: G60.9

## 2022-07-27 SURGERY — CLOSED MANIPULATION SHOULDER WITH STEROID INJECTION
Anesthesia: Choice | Site: Shoulder | Laterality: Left

## 2022-07-27 SURGICAL SUPPLY — 36 items
BLADE SHAVER 4.5X7 STR FR (MISCELLANEOUS) ×1 IMPLANT
BUR BR 5.5 WIDE MOUTH (BURR) IMPLANT
CANNULA PART THRD DISP 5.75X7 (CANNULA) ×1 IMPLANT
CHLORAPREP W/TINT 26 (MISCELLANEOUS) ×1 IMPLANT
COOLER POLAR GLACIER W/PUMP (MISCELLANEOUS) ×1 IMPLANT
COVER LIGHT HANDLE UNIVERSAL (MISCELLANEOUS) ×2 IMPLANT
DRAPE U-SHAPE 48X52 POLY STRL (PACKS) ×2 IMPLANT
ELECT REM PT RETURN 9FT ADLT (ELECTROSURGICAL) ×1
ELECTRODE REM PT RTRN 9FT ADLT (ELECTROSURGICAL) ×1 IMPLANT
GAUZE SPONGE 4X4 12PLY STRL (GAUZE/BANDAGES/DRESSINGS) ×1 IMPLANT
GAUZE XEROFORM 1X8 LF (GAUZE/BANDAGES/DRESSINGS) ×1 IMPLANT
GLOVE SRG 8 PF TXTR STRL LF DI (GLOVE) ×1 IMPLANT
GLOVE SURG ENC MOIS LTX SZ7.5 (GLOVE) ×2 IMPLANT
GLOVE SURG ENC MOIS LTX SZ8 (GLOVE) ×1 IMPLANT
GLOVE SURG UNDER POLY LF SZ8 (GLOVE) ×1
GOWN STRL REUS W/ TWL LRG LVL3 (GOWN DISPOSABLE) ×1 IMPLANT
GOWN STRL REUS W/ TWL XL LVL3 (GOWN DISPOSABLE) ×1 IMPLANT
GOWN STRL REUS W/TWL LRG LVL3 (GOWN DISPOSABLE) ×1
GOWN STRL REUS W/TWL XL LVL3 (GOWN DISPOSABLE) ×1
IV LACTATED RINGER IRRG 3000ML (IV SOLUTION) ×6
IV LR IRRIG 3000ML ARTHROMATIC (IV SOLUTION) ×6 IMPLANT
KIT STABILIZATION SHOULDER (MISCELLANEOUS) ×1 IMPLANT
KIT TURNOVER KIT A (KITS) ×1 IMPLANT
MANIFOLD NEPTUNE II (INSTRUMENTS) ×2 IMPLANT
MASK FACE SPIDER DISP (MASK) ×1 IMPLANT
MAT ABSORB  FLUID 56X50 GRAY (MISCELLANEOUS) ×2
MAT ABSORB FLUID 56X50 GRAY (MISCELLANEOUS) ×2 IMPLANT
PACK ARTHROSCOPY SHOULDER (MISCELLANEOUS) ×1 IMPLANT
PAD WRAPON POLAR SHDR XLG (MISCELLANEOUS) ×1 IMPLANT
SET Y ADAPTER MULIT-BAG IRRIG (MISCELLANEOUS) ×1 IMPLANT
SYR 5ML LL (SYRINGE) ×1 IMPLANT
TAPE MICROFOAM 4IN (TAPE) ×1 IMPLANT
TUBING INFLOW SET DBFLO PUMP (TUBING) ×1 IMPLANT
TUBING OUTFLOW SET DBLFO PUMP (TUBING) ×1 IMPLANT
WAND WEREWOLF FLOW 90D (MISCELLANEOUS) ×1 IMPLANT
WRAPON POLAR PAD SHDR XLG (MISCELLANEOUS) ×1

## 2022-07-27 NOTE — Anesthesia Preprocedure Evaluation (Deleted)

## 2022-07-27 NOTE — Progress Notes (Signed)
Patient canceled due to death in family

## 2022-08-04 ENCOUNTER — Encounter: Payer: Self-pay | Admitting: Orthopedic Surgery

## 2022-08-04 ENCOUNTER — Other Ambulatory Visit: Payer: Self-pay

## 2022-08-10 ENCOUNTER — Other Ambulatory Visit: Payer: Self-pay

## 2022-08-10 ENCOUNTER — Encounter: Payer: Self-pay | Admitting: Orthopedic Surgery

## 2022-08-10 ENCOUNTER — Ambulatory Visit
Admission: RE | Admit: 2022-08-10 | Discharge: 2022-08-10 | Disposition: A | Discharge status: Home / Self Care | Payer: Medicaid Other | Attending: Orthopedic Surgery | Admitting: Orthopedic Surgery

## 2022-08-10 ENCOUNTER — Ambulatory Visit: Payer: Medicaid Other | Admitting: Anesthesiology

## 2022-08-10 ENCOUNTER — Encounter
Admission: RE | Disposition: A | Discharge status: Home / Self Care | Payer: Self-pay | Source: Home / Self Care | Attending: Orthopedic Surgery

## 2022-08-10 DIAGNOSIS — M7552 Bursitis of left shoulder: Secondary | ICD-10-CM | POA: Insufficient documentation

## 2022-08-10 DIAGNOSIS — I10 Essential (primary) hypertension: Secondary | ICD-10-CM | POA: Diagnosis not present

## 2022-08-10 DIAGNOSIS — M659 Synovitis and tenosynovitis, unspecified: Secondary | ICD-10-CM | POA: Insufficient documentation

## 2022-08-10 DIAGNOSIS — M7502 Adhesive capsulitis of left shoulder: Secondary | ICD-10-CM | POA: Diagnosis not present

## 2022-08-10 HISTORY — DX: Dorsalgia, unspecified: M54.9

## 2022-08-10 HISTORY — DX: Presence of dental prosthetic device (complete) (partial): Z97.2

## 2022-08-10 HISTORY — PX: CLOSED MANIPULATION SHOULDER WITH STERIOD INJECTION: SHX5611

## 2022-08-10 SURGERY — CLOSED MANIPULATION SHOULDER WITH STEROID INJECTION
Anesthesia: General | Site: Shoulder | Laterality: Left

## 2022-08-10 MED ORDER — RINGERS IRRIGATION IR SOLN
Status: DC | PRN
  Administered 2022-08-10: 4

## 2022-08-10 MED ORDER — EPINEPHRINE PF 1 MG/ML IJ SOLN
INTRAMUSCULAR | Status: DC | PRN
  Administered 2022-08-10: 4 mL

## 2022-08-10 MED ORDER — FENTANYL CITRATE (PF) 100 MCG/2ML IJ SOLN
INTRAMUSCULAR | Status: DC | PRN
  Administered 2022-08-10: 50 ug via INTRAVENOUS

## 2022-08-10 MED ORDER — LIDOCAINE HCL (CARDIAC) PF 100 MG/5ML IV SOSY
PREFILLED_SYRINGE | INTRAVENOUS | Status: DC | PRN
  Administered 2022-08-10: 100 mg via INTRATRACHEAL

## 2022-08-10 MED ORDER — FENTANYL CITRATE (PF) 100 MCG/2ML IJ SOLN: 100.0000 ug | Freq: Once | INTRAMUSCULAR | Status: DC

## 2022-08-10 MED ORDER — BUPIVACAINE-EPINEPHRINE (PF) 0.5% -1:200000 IJ SOLN: INTRAMUSCULAR | Status: DC | PRN

## 2022-08-10 MED ORDER — PROPOFOL 10 MG/ML IV BOLUS
INTRAVENOUS | Status: DC | PRN
  Administered 2022-08-10: 200 mg via INTRAVENOUS

## 2022-08-10 MED ORDER — LACTATED RINGERS IV SOLN: INTRAVENOUS | Status: DC

## 2022-08-10 MED ORDER — BUPIVACAINE HCL (PF) 0.5 % IJ SOLN
INTRAMUSCULAR | Status: DC | PRN
  Administered 2022-08-10: 30 mL

## 2022-08-10 MED ORDER — TRIAMCINOLONE ACETONIDE 40 MG/ML IJ SUSP
INTRAMUSCULAR | Status: DC | PRN
  Administered 2022-08-10: 80 mg

## 2022-08-10 MED ORDER — BUPIVACAINE HCL 0.5 % IJ SOLN
INTRAMUSCULAR | Status: DC | PRN
  Administered 2022-08-10: 8 mL

## 2022-08-10 MED ORDER — CEFAZOLIN SODIUM-DEXTROSE 2-4 GM/100ML-% IV SOLN
2.0000 g | INTRAVENOUS | Status: AC
  Administered 2022-08-10: 2 g via INTRAVENOUS

## 2022-08-10 MED ORDER — DEXAMETHASONE SODIUM PHOSPHATE 4 MG/ML IJ SOLN
INTRAMUSCULAR | Status: DC | PRN
  Administered 2022-08-10: 4 mg via INTRAVENOUS

## 2022-08-10 MED ORDER — MIDAZOLAM HCL 2 MG/2ML IJ SOLN
2.0000 mg | Freq: Once | INTRAMUSCULAR | Status: AC
  Administered 2022-08-10: 2 mg via INTRAVENOUS

## 2022-08-10 MED ORDER — OXYCODONE HCL 5 MG PO TABS: 5.0000 mg | ORAL_TABLET | ORAL | 0 refills | Status: AC | PRN

## 2022-08-10 MED ORDER — ACETAMINOPHEN 500 MG PO TABS: 1000.0000 mg | ORAL_TABLET | Freq: Three times a day (TID) | ORAL | 2 refills | Status: AC

## 2022-08-10 MED ORDER — ONDANSETRON 4 MG PO TBDP: 4.0000 mg | ORAL_TABLET | Freq: Three times a day (TID) | ORAL | 0 refills | Status: AC | PRN

## 2022-08-10 MED ORDER — ASPIRIN 325 MG PO TBEC: 325.0000 mg | DELAYED_RELEASE_TABLET | Freq: Every day | ORAL | 0 refills | Status: AC

## 2022-08-10 MED ORDER — BUPIVACAINE LIPOSOME 1.3 % IJ SUSP
INTRAMUSCULAR | Status: DC | PRN
  Administered 2022-08-10: 10 mL

## 2022-08-10 MED ORDER — DEXMEDETOMIDINE HCL IN NACL 200 MCG/50ML IV SOLN
INTRAVENOUS | Status: DC | PRN
  Administered 2022-08-10 (×5): 4 ug via INTRAVENOUS

## 2022-08-10 SURGICAL SUPPLY — 35 items
ADPR IRR PORT MULTIBAG TUBE (MISCELLANEOUS) ×1
APL PRP STRL LF DISP 70% ISPRP (MISCELLANEOUS) ×1
BLADE SHAVER 4.5X7 STR FR (MISCELLANEOUS) ×1 IMPLANT
CANNULA PART THRD DISP 5.75X7 (CANNULA) ×1 IMPLANT
CHLORAPREP W/TINT 26 (MISCELLANEOUS) ×1 IMPLANT
COOLER POLAR GLACIER W/PUMP (MISCELLANEOUS) ×1 IMPLANT
COVER LIGHT HANDLE UNIVERSAL (MISCELLANEOUS) ×2 IMPLANT
DRAPE U-SHAPE 48X52 POLY STRL (PACKS) ×2 IMPLANT
GAUZE SPONGE 4X4 12PLY STRL (GAUZE/BANDAGES/DRESSINGS) ×1 IMPLANT
GAUZE XEROFORM 1X8 LF (GAUZE/BANDAGES/DRESSINGS) ×1 IMPLANT
GLOVE SRG 8 PF TXTR STRL LF DI (GLOVE) ×1 IMPLANT
GLOVE SURG ENC MOIS LTX SZ7.5 (GLOVE) ×2 IMPLANT
GLOVE SURG ENC MOIS LTX SZ8 (GLOVE) ×1 IMPLANT
GLOVE SURG UNDER POLY LF SZ8 (GLOVE) ×2
GOWN STRL REUS W/ TWL LRG LVL3 (GOWN DISPOSABLE) ×1 IMPLANT
GOWN STRL REUS W/ TWL XL LVL3 (GOWN DISPOSABLE) ×1 IMPLANT
GOWN STRL REUS W/TWL LRG LVL3 (GOWN DISPOSABLE) ×1
GOWN STRL REUS W/TWL XL LVL3 (GOWN DISPOSABLE) ×1
IV LACTATED RINGER IRRG 3000ML (IV SOLUTION) ×4
IV LR IRRIG 3000ML ARTHROMATIC (IV SOLUTION) ×6 IMPLANT
KIT STABILIZATION SHOULDER (MISCELLANEOUS) ×1 IMPLANT
KIT TURNOVER KIT A (KITS) ×1 IMPLANT
MANIFOLD NEPTUNE II (INSTRUMENTS) ×2 IMPLANT
MASK FACE SPIDER DISP (MASK) ×1 IMPLANT
MAT ABSORB  FLUID 56X50 GRAY (MISCELLANEOUS) ×2
MAT ABSORB FLUID 56X50 GRAY (MISCELLANEOUS) ×2 IMPLANT
PACK ARTHROSCOPY SHOULDER (MISCELLANEOUS) ×1 IMPLANT
PAD WRAPON POLAR SHDR XLG (MISCELLANEOUS) ×1 IMPLANT
SET Y ADAPTER MULIT-BAG IRRIG (MISCELLANEOUS) ×1 IMPLANT
SLING ARM M TX990204 (SOFTGOODS) IMPLANT
SYR 5ML LL (SYRINGE) ×1 IMPLANT
TUBING INFLOW SET DBFLO PUMP (TUBING) ×1 IMPLANT
TUBING OUTFLOW SET DBLFO PUMP (TUBING) ×1 IMPLANT
WAND WEREWOLF FLOW 90D (MISCELLANEOUS) ×1 IMPLANT
WRAPON POLAR PAD SHDR XLG (MISCELLANEOUS) ×1

## 2022-08-10 NOTE — Transfer of Care (Signed)
Immediate Anesthesia Transfer of Care Note  Patient: Seth Brown  Procedure(s) Performed: Left shoulder arthroscopic lysis of adhesions, capsular release, and manipulation under anesthesia with corticosteroid injection (Left: Shoulder)  Patient Location: PACU  Anesthesia Type: General  Level of Consciousness: awake, alert  and patient cooperative  Airway and Oxygen Therapy: Patient Spontanous Breathing and Patient connected to supplemental oxygen  Post-op Assessment: Post-op Vital signs reviewed, Patient's Cardiovascular Status Stable, Respiratory Function Stable, Patent Airway and No signs of Nausea or vomiting  Post-op Vital Signs: Reviewed and stable  Complications: No notable events documented.

## 2022-08-10 NOTE — Anesthesia Preprocedure Evaluation (Addendum)
Anesthesia Evaluation  Patient identified by MRN, date of birth, ID band Patient awake    Reviewed: Allergy & Precautions, H&P , NPO status , Patient's Chart, lab work & pertinent test results  Airway Mallampati: III  TM Distance: >3 FB Neck ROM: Full    Dental no notable dental hx.    Pulmonary neg pulmonary ROS   Pulmonary exam normal breath sounds clear to auscultation       Cardiovascular hypertension, Normal cardiovascular exam Rhythm:Regular Rate:Normal     Neuro/Psych  Neuromuscular disease  negative psych ROS   GI/Hepatic negative GI ROS,,,(+) Hepatitis -, C  Endo/Other  negative endocrine ROS    Renal/GU negative Renal ROS  negative genitourinary   Musculoskeletal negative musculoskeletal ROS (+)    Abdominal   Peds negative pediatric ROS (+)  Hematology negative hematology ROS (+)   Anesthesia Other Findings   Reproductive/Obstetrics negative OB ROS                             Anesthesia Physical Anesthesia Plan  ASA: 3  Anesthesia Plan: General   Post-op Pain Management:    Induction: Intravenous  PONV Risk Score and Plan:   Airway Management Planned: LMA  Additional Equipment:   Intra-op Plan:   Post-operative Plan: Extubation in OR  Informed Consent: I have reviewed the patients History and Physical, chart, labs and discussed the procedure including the risks, benefits and alternatives for the proposed anesthesia with the patient or authorized representative who has indicated his/her understanding and acceptance.     Dental Advisory Given  Plan Discussed with: Anesthesiologist, CRNA and Surgeon  Anesthesia Plan Comments: (Patient consented for risks of anesthesia including but not limited to:  - adverse reactions to medications - damage to eyes, teeth, lips or other oral mucosa - nerve damage due to positioning  - sore throat or hoarseness - Damage to  heart, brain, nerves, lungs, other parts of body or loss of life  Patient voiced understanding.)       Anesthesia Quick Evaluation

## 2022-08-10 NOTE — H&P (Signed)
Paper H&P to be scanned into permanent record. H&P reviewed. No significant changes noted.  

## 2022-08-10 NOTE — Anesthesia Procedure Notes (Signed)
Anesthesia Regional Block: Interscalene brachial plexus block   Pre-Anesthetic Checklist: , timeout performed,  Correct Patient, Correct Site, Correct Laterality,  Correct Procedure, Correct Position, site marked,  Risks and benefits discussed,  Surgical consent,  Pre-op evaluation,  At surgeon's request and post-op pain management  Laterality: Left  Prep: chloraprep       Needles:  Injection technique: Single-shot  Needle Type: Stimiplex     Needle Length: 10cm  Needle Gauge: 21     Additional Needles:   Procedures:,,,, ultrasound used (permanent image in chart),,    Narrative:  Start time: 08/10/2022 12:52 PM End time: 08/10/2022 1:04 PM Injection made incrementally with aspirations every 5 mL.  Performed by: Personally   Additional Notes: Functioning IV was confirmed and monitors applied. Ultrasound guidance: relevant anatomy identified, needle position confirmed, local anesthetic spread visualized around nerve(s)., vascular puncture avoided.  Image printed for medical record.  Negative aspiration and no paresthesias; incremental administration of local anesthetic. The patient tolerated the procedure well. Vitals signes recorded in RN notes. Also superficial cervical plexus block and intercostobrachal block

## 2022-08-10 NOTE — Discharge Instructions (Addendum)
Post-Op Instructions - Shoulder Capsular Release/Manipulation Under Anesthesia  1. Bracing: You should wear a sling for comfort only. Sling should NOT be worn longer than ~1 week.   2. Driving: No driving for 1 week post-op. Must be off narcotic pain medication.  3. Activity: No active lifting for ~2 weeks. Perform range of motion exercises DAILY at home and with physical therapy as prescribed.   4. Physical Therapy: This NEEDS to begin the day after surgery, and proceed ~6-12 weeks. This should be at least 3x/week.   5. Medications:  - You will be provided a prescription for narcotic pain medicine. After surgery, take 1-2 narcotic tablets every 4 hours if needed for severe pain.  - A prescription for anti-nausea medication will be provided in case the narcotic medicine causes nausea - take 1 tablet every 6 hours only if nauseated.   - Take tylenol 1000 mg (2 Extra Strength tablets or 3 regular strength) every 8 hours for pain.  May decrease or stop tylenol 5 days after surgery if you are having minimal pain. - Take resume taking meloxicam the day after surgery. - Take Aspirin 325mg /daily x 2 weeks to help prevent DVT/PE (Blood clots)   If you are taking prescription medication for anxiety, depression, insomnia, muscle spasm, chronic pain, or for attention deficit disorder, you are advised that you are at a higher risk of adverse effects with use of narcotics post-op, including narcotic addiction/dependence, depressed breathing, death. If you use non-prescribed substances: alcohol, marijuana, cocaine, heroin, methamphetamines, etc., you are at a higher risk of adverse effects with use of narcotics post-op, including narcotic addiction/dependence, depressed breathing, death. You are advised that taking > 50 morphine milligram equivalents (MME) of narcotic pain medication per day results in twice the risk of overdose or death. For your prescription provided: oxycodone 5 mg - taking more than 6  tablets per day would result in > 50 morphine milligram equivalents (MME) of narcotic pain medication. Be advised that we will prescribe narcotics short-term, for acute post-operative pain only - 3 weeks for major operations such as shoulder repair/reconstruction surgeries.    6. Post-Op Appointment:  Your first post-op appointment will be ~2 weeks post-op.  7. Work or School: For most, but not all procedures, we advise staying out of work or school for at least 1 to 2 weeks in order to recover from the stress of surgery and to allow time for healing.   If you need a work or school note this can be provided.

## 2022-08-10 NOTE — Anesthesia Procedure Notes (Signed)
Procedure Name: LMA Insertion Date/Time: 08/10/2022 1:30 PM  Performed by: Barbette Hair, CRNAPre-anesthesia Checklist: Patient identified, Emergency Drugs available, Suction available, Patient being monitored and Timeout performed Patient Re-evaluated:Patient Re-evaluated prior to induction Oxygen Delivery Method: Circle system utilized Preoxygenation: Pre-oxygenation with 100% oxygen Induction Type: IV induction LMA: LMA inserted LMA Size: 5.0 Tube type: Oral Number of attempts: 1 Tube secured with: Tape Dental Injury: Teeth and Oropharynx as per pre-operative assessment

## 2022-08-10 NOTE — Op Note (Signed)
OPERATIVE NOTE SURGERY DATE: 08/10/2022  PRE-OP DIAGNOSIS: 1. Left shoulder adhesive capsulitis and synovitis 2. Left subacromial bursitis 3. Left shoulder adhesions   POST-OP DIAGNOSIS:  1. Left shoulder adhesive capsulitis and synovitis 2. Left subacromial bursitis 3. Left shoulder adhesions  PROCEDURES: 1. Left shoulder capsular release with synovectomy, lysis of adhesions, manipulation under anesthesia  2. Left subacromial decompression without acromioplasty  3. Left glenohumeral and subacromial injections with corticosteroid  SURGEON:  Rosealee Albee, MD  ASSISTANT(S):  none  ANESTHESIA: Regional block with Exparel, Gen  TOTAL IV FLUIDS: per anesthesia record   ESTIMATED BLOOD LOSS: Minimal  DRAINS:  None.  SPECIMENS: None  IMPLANTS: None.  COMPLICATIONS: none  INDICATIONS: Seth Brown is a 62 y.o. male with complaints of left shoulder pain and stiffness. Preoperative left shoulder examination was notable for severe motion loss and pain. The patient has failed two rounds of corticosteroid injections and extensive physical therapy exercises.  Surgery was recommended for capsular releases, manipulation under anesthesia, and subacromial decompression/bursectomy with corticosteroid injections into glenohumeral joint and subacromial space. After discussion of risks, benefits, and alternatives to surgery, the patient elected to proceed.    OPERATIVE FINDINGS:  Operative Shoulder Range of Motion:  Preop  Postop  Flexion  110 150  Abduction  90 130  ER at 0  30 60  ER at 90  80 105  IR at 90  10 45  IR posterior  L2 T8    Intra-operative findings: A thorough arthroscopic examination of the shoulder was performed.  The findings are: 1. Biceps tendon: Normal 2. Superior labrum: Significant degenerative fraying 3. Posterior labrum and capsule: Significant synovitis and thickening of the capsule 4. Inferior capsule and inferior recess: Significant synovitis and  thickening of capsule 5. Glenoid cartilage surface: Normal 6. Supraspinatus attachment: Normal 7. Posterior rotator cuff attachment:  Normal 8. Humeral head articular cartilage: Scattered areas of grade 1-2 degenerative change 9. Rotator interval: significant synovitis and thickening of capsule 10: Subscapularis tendon:  Normal with some surrounding adhesions 11. Anterior labrum: Normal 12. IGHL: significant synovitis around IGHL   DETAILS OF PROCEDURE: The patient was identified in the preoperative holding area. Informed consent was obtained. Operative extremity was marked. After satisfactory upper extremity regional block with Exparel was performed in the preoperative holding area, the patient was brought to the operating room and placed in a well-padded beach chair positioner.  Eyes were protected, head was affixed in neutral, and the patient was given preoperative IV antibiotics within 30 minutes of the start of the case and a surgical time-out occurred. The upper extremity was prescrubbed with Hibiclens and alcohol, prepped with ChloraPrep and draped in the usual sterile fashion.    I then created a standard posterior portal with an 11 blade. The glenohumeral joint was entered with a blunt trochar and the arthroscope introduced. The findings of diagnostic arthroscopy are described above.  A standard anterior portal was made.  The joint was remarkable for moderate synovitis which was chronic in the anterior, superior, posterior, and inferior aspects. This required synovectomy with a shaver and Arthrocare device in the affected compartments listed above.  A combination of electrocautery and oscillating shaver was used to debride the rotator interval tissue.  The posterior aspect of the coracoid was exposed.  The adhesions off of the anterior and posterior aspects of the subscapularis were cleared so there was no tethering. Next, an upbiting duckbill basket was then used to perform a capsulotomy of  the rotator interval  and then the MGHL and the IGHL (anterior band).  Care was taken to protect the intraarticular subscapularis.  Adhesions were cleared off the subscapularis to allow full internal and external rotation.    The arthroscope was placed into the anterior portal.  The posterior capsule was quite thickened and inflamed as well.  After synovectomy, the duckbill basket was used to perform release from the superior glenoid, down into the axillary pouch, around to the anterior band of the IGHL.  A complete 360 capsulotomy was performed in this manner.  Care was taken to protect the axillary nerve by staying on the glenoid side and making sure not to rotate the shoulder externally during the capsulotomy.  Hemostasis was achieved with the ArthroCare wand.  There was no unusual bleeding.    The arthroscope was placed in the subacromial space. An accessory lateral portal was established. 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  A complete subacromial bursectomy and debridement of the gutters and subacromial space was carried out with a shaver.  ArthroCare was used to control bleeding. The instruments were removed from the shoulder.    Manipulation under anesthesia was carried out in a gentle, controlled manner with short lever arms.  There was palpable release of adhesions. See above chart for post-manipulation improvement in range of motion.   The skin was closed with interrupted 3-0 nylon sutures. Injections of 40 mg Kenalog with and 0.5% ropivacaine were placed separately in the glenohumeral joint and subacromial space with a spinal needle.  Xeroform gauze, sterile dressings were applied. The patient was placed in a shoulder sling.  Polar Care was applied.    Instrument, sponge, and needle counts were correct prior to closure and at the conclusion of the case.   DISPOSITION: PACU - hemodynamically stable.  POSTOPERATIVE PLAN: The patient will be discharged  home. PT to begin 1 day postop for range of motion exercises.  ASA x 2 weeks for DVT ppx. Sling only for comfort and wean this week as soon as tolerated. RTC in ~2 weeks.

## 2022-08-11 ENCOUNTER — Encounter: Payer: Self-pay | Admitting: Orthopedic Surgery

## 2022-08-12 NOTE — Anesthesia Postprocedure Evaluation (Signed)
Anesthesia Post Note  Patient: Seth Brown  Procedure(s) Performed: Left shoulder arthroscopic lysis of adhesions, capsular release, and manipulation under anesthesia with corticosteroid injection (Left: Shoulder)  Patient location during evaluation: PACU Anesthesia Type: General Level of consciousness: awake and alert Pain management: pain level controlled Vital Signs Assessment: post-procedure vital signs reviewed and stable Respiratory status: spontaneous breathing, nonlabored ventilation, respiratory function stable and patient connected to nasal cannula oxygen Cardiovascular status: blood pressure returned to baseline and stable Postop Assessment: no apparent nausea or vomiting Anesthetic complications: no   No notable events documented.   Last Vitals:  Vitals:   08/10/22 1445 08/10/22 1507  BP: 112/83 111/73  Pulse: 63   Resp: 18   Temp: (!) 36.4 C (!) 36.4 C  SpO2: 99% 97%    Last Pain:  Vitals:   08/11/22 1129  TempSrc:   PainSc: 0-No pain                 Seth Brown

## 2022-10-09 ENCOUNTER — Emergency Department
Admission: EM | Admit: 2022-10-09 | Discharge: 2022-10-09 | Disposition: A | Discharge status: Home / Self Care | Payer: Medicaid Other | Attending: Student in an Organized Health Care Education/Training Program | Admitting: Student in an Organized Health Care Education/Training Program

## 2022-10-09 ENCOUNTER — Other Ambulatory Visit: Payer: Self-pay

## 2022-10-09 ENCOUNTER — Emergency Department: Payer: Medicaid Other

## 2022-10-09 DIAGNOSIS — R42 Dizziness and giddiness: Secondary | ICD-10-CM | POA: Diagnosis present

## 2022-10-09 LAB — TROPONIN I (HIGH SENSITIVITY)
Troponin I (High Sensitivity): 4 ng/L (ref <18)
Troponin I (High Sensitivity): 4 ng/L (ref <18)

## 2022-10-09 LAB — URINALYSIS, ROUTINE W REFLEX MICROSCOPIC
Bacteria, UA: NONE SEEN
Bilirubin Urine: NEGATIVE
Glucose, UA: NEGATIVE mg/dL
Hgb urine dipstick: NEGATIVE
Ketones, ur: 5 mg/dL — AB
Leukocytes,Ua: NEGATIVE
Nitrite: NEGATIVE
Protein, ur: 30 mg/dL — AB
Specific Gravity, Urine: 1.017 (ref 1.005–1.030)
Squamous Epithelial / HPF: NONE SEEN /HPF (ref 0–5)
pH: 8 (ref 5.0–8.0)

## 2022-10-09 LAB — CBC
HCT: 41.2 % (ref 39.0–52.0)
Hemoglobin: 13.3 g/dL (ref 13.0–17.0)
MCH: 27.1 pg (ref 26.0–34.0)
MCHC: 32.3 g/dL (ref 30.0–36.0)
MCV: 84.1 fL (ref 80.0–100.0)
Platelets: 238 10*3/uL (ref 150–400)
RBC: 4.9 MIL/uL (ref 4.22–5.81)
RDW: 12.9 % (ref 11.5–15.5)
WBC: 9.1 10*3/uL (ref 4.0–10.5)
nRBC: 0 % (ref 0.0–0.2)

## 2022-10-09 LAB — BASIC METABOLIC PANEL
Anion gap: 13 (ref 5–15)
BUN: 12 mg/dL (ref 8–23)
CO2: 20 mmol/L — ABNORMAL LOW (ref 22–32)
Calcium: 9 mg/dL (ref 8.9–10.3)
Chloride: 103 mmol/L (ref 98–111)
Creatinine, Ser: 1.09 mg/dL (ref 0.61–1.24)
GFR, Estimated: >60 mL/min (ref >60)
Glucose, Bld: 157 mg/dL — ABNORMAL HIGH (ref 70–99)
Potassium: 3.4 mmol/L — ABNORMAL LOW (ref 3.5–5.1)
Sodium: 136 mmol/L (ref 135–145)

## 2022-10-09 MED ORDER — LORAZEPAM 2 MG/ML IJ SOLN
1.0000 mg | Freq: Once | INTRAMUSCULAR | Status: AC
  Administered 2022-10-09: 1 mg via INTRAVENOUS
  Filled 2022-10-09: qty 1

## 2022-10-09 MED ORDER — MECLIZINE HCL 25 MG PO TABS: 25.0000 mg | ORAL_TABLET | Freq: Three times a day (TID) | ORAL | 0 refills | Status: DC | PRN

## 2022-10-09 MED ORDER — ONDANSETRON 4 MG PO TBDP: 4.0000 mg | ORAL_TABLET | Freq: Three times a day (TID) | ORAL | 0 refills | Status: AC | PRN

## 2022-10-09 MED ORDER — MECLIZINE HCL 25 MG PO TABS
25.0000 mg | ORAL_TABLET | Freq: Once | ORAL | Status: DC
  Filled 2022-10-09: qty 1

## 2022-10-09 MED ORDER — HALOPERIDOL LACTATE 5 MG/ML IJ SOLN
5.0000 mg | Freq: Once | INTRAMUSCULAR | Status: AC
  Administered 2022-10-09: 5 mg via INTRAMUSCULAR
  Filled 2022-10-09: qty 1

## 2022-10-09 MED ORDER — IOHEXOL 350 MG/ML SOLN
100.0000 mL | Freq: Once | INTRAVENOUS | Status: AC | PRN
  Administered 2022-10-09: 100 mL via INTRAVENOUS

## 2022-10-09 NOTE — ED Notes (Signed)
Pt taken to MRI by this RN and MRI tech. Pt endorses anxiety - see MAR for intervention. Roughly ~ 5-10 mins into the scan the patient requests to stop the scan and declines any additional meds for anxiety. Pt brought back to the department and attached to the monitor. Primary RN notified of the situation.

## 2022-10-09 NOTE — ED Notes (Signed)
Informed patient of patient vomiting. No new orders at this time.

## 2022-10-09 NOTE — ED Triage Notes (Signed)
Pt bib EMS at this time after visiting the urgent care and felt dizziness but did not pass out. States this started about 2 hours ago. Medical hx of hypertension and chronic back pain.

## 2022-10-09 NOTE — Discharge Instructions (Signed)
IMPRESSION:  1. No acute abnormality in the abdomen/pelvis.  2. Mild sigmoid colonic diverticulosis without diverticulitis.  3. A 10 mm low-density lesion in the anterior mid right kidney has  Hounsfield units of 48. This may represent a hemorrhagic or  proteinaceous cyst, however recommend nonemergent renal protocol MRI  for characterization.      Electronically Signed    By: Narda Rutherford M.D.    On: 10/09/2022 23:19

## 2022-10-09 NOTE — ED Provider Notes (Signed)
[facility] Provider Note    Event Date/Time   First MD Initiated Contact with Patient 10/09/22 Ernestina Columbia     (approximate)   History   Dizziness   HPI  Dontee Kilkenny Steig is a 62 y.o. male who presents to the ER for evaluation of sudden onset dizziness and feeling the room is spinning.  Symptoms occurred while he was talking on the phone with his wife and started.  Has never had symptoms like this before.  Does feel severe nausea and is dry heaving on arrival.  [place] to urgent care initially felt like he was about to pass out.  No LOC.  Denies any chest pain.     Physical Exam   Triage Vital Signs: ED Triage Vitals  Enc Vitals Group     BP 10/09/22 1927 (!) 169/89     Pulse Rate 10/09/22 1927 67     Resp 10/09/22 1927 20     Temp 10/09/22 1934 (!) 96.4 F (35.8 C)     Temp Source 10/09/22 1934 Axillary     SpO2 10/09/22 1931 99 %     Weight 10/09/22 1928 195 lb (88.5 kg)     Height 10/09/22 1928 5\' 10"  (1.778 m)     Head Circumference --      Peak Flow --      Pain Score 10/09/22 1928 0     Pain Loc --      Pain Edu? --      Excl. in GC? --     Most recent vital signs: Vitals:   10/09/22 2130 10/09/22 2145  BP: (!) 158/103   Pulse: 79 79  Resp:    Temp:    SpO2: 100% 98%     Constitutional: Alert  Eyes: Conjunctivae are normal.  Bilateral lateral nystagmus  Head: Atraumatic. Nose: No congestion/rhinnorhea. Mouth/Throat: Mucous membranes are moist.   Neck: Painless ROM.  Cardiovascular:   Good peripheral circulation. Respiratory: Normal respiratory effort.  No retractions.  Gastrointestinal: Soft and nontender.  Musculoskeletal:  no deformity Neurologic:  MAE spontaneously. No gross focal neurologic deficits are appreciated.  Skin:  Skin is warm, dry and intact. No rash noted. Psychiatric: Mood and affect are normal. Speech and behavior are normal.    ED Results / Procedures / Treatments   Labs (all labs ordered are listed,  but only abnormal results are displayed) Labs Reviewed  BASIC METABOLIC PANEL - Abnormal; Notable for the following components:      Result Value   Potassium 3.4 (*)    CO2 20 (*)    Glucose, Bld 157 (*)    All other components within normal limits  URINALYSIS, ROUTINE W REFLEX MICROSCOPIC - Abnormal; Notable for the following components:   Color, Urine YELLOW (*)    APPearance CLOUDY (*)    Ketones, ur 5 (*)    Protein, ur 30 (*)    All other components within normal limits  CBC  CBG MONITORING, ED  TROPONIN I (HIGH SENSITIVITY)  TROPONIN I (HIGH SENSITIVITY)     EKG  ED ECG REPORT I, Willy Eddy, the attending physician, personally viewed and interpreted this ECG.   Date: 10/09/2022  EKG Time: 19:25  Rate: 70  Rhythm: sinus  Axis: normal  Intervals: normal  ST&T Change: no stemi, no depresisons    RADIOLOGY Please see ED Course for my review and interpretation.  I personally reviewed all radiographic images ordered to evaluate for the above acute complaints and  reviewed radiology reports and findings.  These findings were personally discussed with the patient.  Please see medical record for radiology report.    PROCEDURES:  Critical Care performed: No  Procedures   MEDICATIONS ORDERED IN ED: Medications  meclizine (ANTIVERT) tablet 25 mg (25 mg Oral Not Given 10/09/22 2049)  LORazepam (ATIVAN) injection 1 mg (1 mg Intravenous Given 10/09/22 2004)  haloperidol lactate (HALDOL) injection 5 mg (5 mg Intramuscular Given 10/09/22 2155)  iohexol (OMNIPAQUE) 350 MG/ML injection 100 mL (100 mLs Intravenous Contrast Given 10/09/22 2254)     IMPRESSION / MDM / ASSESSMENT AND PLAN / ED COURSE  I reviewed the triage vital signs and the nursing notes.                              Differential diagnosis includes, but is not limited to, BPPV, the [place], CVA, IPH, hypertensive urgency, enteritis, gastritis  Patient presenting to the ER for evaluation of symptoms as  described above.  Based on symptoms, risk factors and considered above differential, this presenting complaint could reflect a potentially life-threatening illness therefore the patient will be placed on continuous pulse oximetry and telemetry for monitoring.  Laboratory evaluation will be sent to evaluate for the above complaints.     Clinical Course as of 10/09/22 2331  Fri Oct 09, 2022  2025 CT imaging on my review and interpretation does not show any evidence of stroke or bleed.  Does appear that Ativan has helped with his symptoms. [PR]  2233 Patient was not able to tolerate MRI.  Will order CTA to further evaluate.  Also complaining of worsening abdominal pain not tolerating p.o. will order CT abdomen pelvis as well. [PR]  2314 CT abdomen pelvis, reviewed interpretation without evidence of obstruction will await formal radiology report [PR]  2314 Patient is feeling improved. [PR]  2315 If imaging is negative anticipate patient will be appropriate for outpatient follow-up as most likely peripheral vertigo. [PR]  2329 Patient feeling significantly improved.  CT imaging without acute abnormality.  He is tolerating p.o. now.  Able to ambulate.  Does appear appropriate for outpatient follow-up. [PR]    Clinical Course User Index [PR] Willy Eddy, MD     FINAL CLINICAL IMPRESSION(S) / ED DIAGNOSES   Final diagnoses:  Vertigo     Rx / DC Orders   ED Discharge Orders          Ordered    ondansetron (ZOFRAN-ODT) 4 MG disintegrating tablet  Every 8 hours PRN        10/09/22 2331    meclizine (ANTIVERT) 25 MG tablet  3 times daily PRN        10/09/22 2331             Note:  This document was prepared using Dragon voice recognition software and may include unintentional dictation errors.    Willy Eddy, MD 10/09/22 [phone]

## 2023-03-22 ENCOUNTER — Emergency Department
Admission: EM | Admit: 2023-03-22 | Discharge: 2023-03-22 | Disposition: A | Discharge status: Home / Self Care | Payer: Medicaid Other | Attending: Emergency Medicine | Admitting: Emergency Medicine

## 2023-03-22 ENCOUNTER — Other Ambulatory Visit: Payer: Self-pay

## 2023-03-22 DIAGNOSIS — Z760 Encounter for issue of repeat prescription: Secondary | ICD-10-CM | POA: Insufficient documentation

## 2023-03-22 DIAGNOSIS — H81399 Other peripheral vertigo, unspecified ear: Secondary | ICD-10-CM | POA: Diagnosis not present

## 2023-03-22 DIAGNOSIS — I10 Essential (primary) hypertension: Secondary | ICD-10-CM | POA: Insufficient documentation

## 2023-03-22 DIAGNOSIS — R42 Dizziness and giddiness: Secondary | ICD-10-CM | POA: Diagnosis present

## 2023-03-22 LAB — BASIC METABOLIC PANEL
Anion gap: 9 (ref 5–15)
BUN: 13 mg/dL (ref 8–23)
CO2: 26 mmol/L (ref 22–32)
Calcium: 9.4 mg/dL (ref 8.9–10.3)
Chloride: 104 mmol/L (ref 98–111)
Creatinine, Ser: 1.05 mg/dL (ref 0.61–1.24)
GFR, Estimated: >60 mL/min (ref >60)
Glucose, Bld: 109 mg/dL — ABNORMAL HIGH (ref 70–99)
Potassium: 4.3 mmol/L (ref 3.5–5.1)
Sodium: 139 mmol/L (ref 135–145)

## 2023-03-22 LAB — CBC
HCT: 45 % (ref 39.0–52.0)
Hemoglobin: 14.9 g/dL (ref 13.0–17.0)
MCH: 27.2 pg (ref 26.0–34.0)
MCHC: 33.1 g/dL (ref 30.0–36.0)
MCV: 82.1 fL (ref 80.0–100.0)
Platelets: 263 10*3/uL (ref 150–400)
RBC: 5.48 MIL/uL (ref 4.22–5.81)
RDW: 15 % (ref 11.5–15.5)
WBC: 7.3 10*3/uL (ref 4.0–10.5)
nRBC: 0 % (ref 0.0–0.2)

## 2023-03-22 MED ORDER — MECLIZINE HCL 12.5 MG PO TABS: 25.0000 mg | ORAL_TABLET | Freq: Three times a day (TID) | ORAL | 1 refills | Status: AC | PRN

## 2023-03-22 NOTE — ED Provider Notes (Signed)
[facility] Provider Note    Event Date/Time   First MD Initiated Contact with Patient 03/22/23 1818     (approximate)   History   Chief Complaint Dizziness (/) and Medication Refill   HPI  Seth Brown is a 62 y.o. male with past medical history of hypertension, hep C, and chronic pain syndrome who presents to the ED complaining of dizziness.  Patient reports that he has been feeling dizzy with sensation of the room spinning around him when he goes to stand up since yesterday evening.  He describes symptoms as similar to what he was seen for in the ED in July, workup at that time was negative for stroke and he was prescribed meclizine.  Patient states that the meclizine has helped him in the past and he is requesting a refill for that medication.  He denies any vision changes, speech changes, numbness, or weakness in his extremities with return of the symptoms today.     Physical Exam   Triage Vital Signs: ED Triage Vitals  Encounter Vitals Group     BP 03/22/23 1506 (!) 156/94     Systolic BP Percentile --      Diastolic BP Percentile --      Pulse Rate 03/22/23 1506 86     Resp 03/22/23 1506 16     Temp 03/22/23 1506 97.9 F (36.6 C)     Temp Source 03/22/23 1506 Oral     SpO2 03/22/23 1506 98 %     Weight --      Height --      Head Circumference --      Peak Flow --      Pain Score 03/22/23 1505 0     Pain Loc --      Pain Education --      Exclude from Growth Chart --     Most recent vital signs: Vitals:   03/22/23 1506  BP: (!) 156/94  Pulse: 86  Resp: 16  Temp: 97.9 F (36.6 C)  SpO2: 98%    Constitutional: Alert and oriented. Eyes: Conjunctivae are normal. Head: Atraumatic. Nose: No congestion/rhinnorhea. Mouth/Throat: Mucous membranes are moist.  Cardiovascular: Normal rate, regular rhythm. Grossly normal heart sounds.  2+ radial pulses bilaterally. Respiratory: Normal respiratory effort.  No retractions. Lungs  CTAB. Gastrointestinal: Soft and nontender. No distention. Musculoskeletal: No lower extremity tenderness nor edema.  Neurologic:  Normal speech and language. No gross focal neurologic deficits are appreciated.    ED Results / Procedures / Treatments   Labs (all labs ordered are listed, but only abnormal results are displayed) Labs Reviewed  BASIC METABOLIC PANEL - Abnormal; Notable for the following components:      Result Value   Glucose, Bld 109 (*)    All other components within normal limits  CBC    PROCEDURES:  Critical Care performed: No  Procedures   MEDICATIONS ORDERED IN ED: Medications - No data to display   IMPRESSION / MDM / ASSESSMENT AND PLAN / ED COURSE  I reviewed the triage vital signs and the nursing notes.                              62 y.o. male with past medical history of hypertension, hep C, chronic pain syndrome who presents to the ED complaining of dizziness and sensation of the room spinning when standing since last night.  Patient's presentation is  most consistent with acute complicated illness / injury requiring diagnostic workup.  Differential diagnosis includes, but is not limited to, peripheral vertigo, central vertigo, anemia, electrolyte abnormality, AKI.  Patient well-appearing and in no acute distress, vital signs are unremarkable.  He has no focal neurologic deficits on exam and symptoms seem most consistent with a peripheral vertigo.  He previously had extensive workup for what he describes as the same symptoms back in July, including MRI and CTA that were unremarkable.  Labs today are reassuring with no significant anemia, leukocytosis, electrolyte abnormality, or AKI.  Patient appropriate for discharge home with prescription for meclizine, will also provide referral to follow-up with ENT if he continues to have symptoms.  He was counseled to return to the ED for new or worsening symptoms.  Patient agrees with plan.      FINAL  CLINICAL IMPRESSION(S) / ED DIAGNOSES   Final diagnoses:  Peripheral vertigo, unspecified laterality     Rx / DC Orders   ED Discharge Orders          Ordered    meclizine (ANTIVERT) 12.5 MG tablet  3 times daily PRN        03/22/23 1839             Note:  This document was prepared using Dragon voice recognition software and may include unintentional dictation errors.   Chesley Noon, MD 03/22/23 [phone]

## 2023-03-22 NOTE — ED Triage Notes (Signed)
Pt has return of vertigo. Room is spinning and pt has had N/V. States whatever he had previously helped it but he has run out of the medication. Does not remember the name of the medication.

## 2023-03-22 NOTE — ED Notes (Signed)
Dr Jessup at bedside 

## 2023-12-16 ENCOUNTER — Other Ambulatory Visit: Payer: Self-pay

## 2023-12-16 ENCOUNTER — Telehealth: Payer: Self-pay

## 2023-12-16 DIAGNOSIS — Z1211 Encounter for screening for malignant neoplasm of colon: Secondary | ICD-10-CM

## 2023-12-16 MED ORDER — NA SULFATE-K SULFATE-MG SULF 17.5-3.13-1.6 GM/177ML PO SOLN: 1.0000 | Freq: Once | ORAL | 0 refills | Status: AC

## 2023-12-16 NOTE — Telephone Encounter (Signed)
 Gastroenterology Pre-Procedure Review  Request Date: 01/17/24 Requesting Physician: Dr. Jinny   PATIENT REVIEW QUESTIONS: The patient responded to the following health history questions as indicated:    1. Are you having any GI issues? no 2. Do you have a personal history of Polyps? no 3. Do you have a family history of Colon Cancer or Polyps? no 4. Diabetes Mellitus? no 5. Joint replacements in the past 12 months?no 6. Major health problems in the past 3 months?no 7. Any artificial heart valves, MVP, or defibrillator?no    MEDICATIONS & ALLERGIES:    Patient reports the following regarding taking any anticoagulation/antiplatelet therapy:   Plavix, Coumadin, Eliquis, Xarelto, Lovenox, Pradaxa, Brilinta, or Effient? no Aspirin ? no  Patient confirms/reports the following medications:  Current Outpatient Medications  Medication Sig Dispense Refill   amLODipine  (NORVASC ) 10 MG tablet Take 1 tablet (10 mg total) by mouth daily. 30 tablet 0   diazepam  (VALIUM ) 5 MG tablet 1 po 1 hour prior to MRI scan. May repeat x 1 right before MRI if needed. Will need driver- this can make you sleepy. (Patient not taking: Reported on 08/04/2022) 2 tablet 0   meclizine  (ANTIVERT ) 12.5 MG tablet Take 2 tablets (25 mg total) by mouth 3 (three) times daily as needed for dizziness. 30 tablet 1   methocarbamol  (ROBAXIN ) 500 MG tablet Take 1 tablet (500 mg total) by mouth 4 (four) times daily. (Patient not taking: Reported on 07/16/2022) 16 tablet 0   methylPREDNISolone  (MEDROL  DOSEPAK) 4 MG TBPK tablet Use as directed x 6 days. (Patient not taking: Reported on 07/16/2022) 1 each 0   ondansetron  (ZOFRAN -ODT) 4 MG disintegrating tablet Take 1 tablet (4 mg total) by mouth every 8 (eight) hours as needed for nausea or vomiting. 20 tablet 0   ondansetron  (ZOFRAN -ODT) 4 MG disintegrating tablet Take 1 tablet (4 mg total) by mouth every 8 (eight) hours as needed for nausea or vomiting. 8 tablet 0   oxyCODONE  (ROXICODONE )  15 MG immediate release tablet Take 15 mg by mouth 3 (three) times daily.     No current facility-administered medications for this visit.    Patient confirms/reports the following allergies:  Allergies  Allergen Reactions   Amoxicillin  Hives    No orders of the defined types were placed in this encounter.   AUTHORIZATION INFORMATION Primary Insurance: 1D#: Group #:  Secondary Insurance: 1D#: Group #:  SCHEDULE INFORMATION: Date: 01/17/24 Time: Location: [facility]

## 2024-01-17 ENCOUNTER — Ambulatory Visit: Admitting: Anesthesiology

## 2024-01-17 ENCOUNTER — Ambulatory Visit
Admission: RE | Admit: 2024-01-17 | Discharge: 2024-01-17 | Disposition: A | Discharge status: Home / Self Care | Attending: Gastroenterology | Admitting: Gastroenterology

## 2024-01-17 ENCOUNTER — Encounter
Admission: RE | Disposition: A | Discharge status: Home / Self Care | Payer: Self-pay | Source: Home / Self Care | Attending: Gastroenterology

## 2024-01-17 ENCOUNTER — Encounter: Payer: Self-pay | Admitting: Gastroenterology

## 2024-01-17 DIAGNOSIS — D123 Benign neoplasm of transverse colon: Secondary | ICD-10-CM | POA: Insufficient documentation

## 2024-01-17 DIAGNOSIS — Z1211 Encounter for screening for malignant neoplasm of colon: Secondary | ICD-10-CM | POA: Diagnosis not present

## 2024-01-17 DIAGNOSIS — I10 Essential (primary) hypertension: Secondary | ICD-10-CM | POA: Insufficient documentation

## 2024-01-17 DIAGNOSIS — K635 Polyp of colon: Secondary | ICD-10-CM

## 2024-01-17 HISTORY — PX: COLONOSCOPY: SHX5424

## 2024-01-17 HISTORY — PX: POLYPECTOMY: SHX149

## 2024-01-17 SURGERY — COLONOSCOPY
Anesthesia: General

## 2024-01-17 MED ORDER — PROPOFOL 500 MG/50ML IV EMUL
INTRAVENOUS | Status: DC | PRN
  Administered 2024-01-17: 150 ug/kg/min via INTRAVENOUS

## 2024-01-17 MED ORDER — PROPOFOL 1000 MG/100ML IV EMUL
INTRAVENOUS | Status: AC
  Filled 2024-01-17: qty 100

## 2024-01-17 MED ORDER — SODIUM CHLORIDE 0.9 % IV SOLN
INTRAVENOUS | Status: DC
  Administered 2024-01-17: 20 mL/h via INTRAVENOUS

## 2024-01-17 MED ORDER — PROPOFOL 10 MG/ML IV BOLUS
INTRAVENOUS | Status: DC | PRN
  Administered 2024-01-17: 100 mg via INTRAVENOUS

## 2024-01-17 MED ORDER — LIDOCAINE HCL (CARDIAC) PF 100 MG/5ML IV SOSY
PREFILLED_SYRINGE | INTRAVENOUS | Status: DC | PRN
  Administered 2024-01-17: 60 mg via INTRAVENOUS

## 2024-01-17 MED ORDER — LIDOCAINE HCL (PF) 2 % IJ SOLN
INTRAMUSCULAR | Status: AC
  Filled 2024-01-17: qty 5

## 2024-01-17 NOTE — Transfer of Care (Signed)
 Immediate Anesthesia Transfer of Care Note  Patient: Seth Brown  Procedure(s) Performed: COLONOSCOPY POLYPECTOMY, INTESTINE  Patient Location: PACU  Anesthesia Type:General  Level of Consciousness: awake and sedated  Airway & Oxygen Therapy: Patient Spontanous Breathing and Patient connected to nasal cannula oxygen  Post-op Assessment: Report given to RN and Post -op Vital signs reviewed and stable  Post vital signs: Reviewed and stable  Last Vitals:  Vitals Value Taken Time  BP    Temp    Pulse 69 01/17/24 08:59  Resp 16 01/17/24 09:00  SpO2 100 % 01/17/24 08:59  Vitals shown include unfiled device data.  Last Pain:  Vitals:   01/17/24 0754  TempSrc: Temporal  PainSc: 5          Complications: There were no known notable events for this encounter.

## 2024-01-17 NOTE — H&P (Signed)
 Seth Copping, MD [facility] [address] [place], [place] [address] Phone: [phone] Fax : [phone]  Primary Care Physician:  [facility] Primary Gastroenterologist:  Dr. Copping  Pre-Procedure History & Physical: HPI:  Seth Brown is a 63 y.o. male is here for a screening colonoscopy.   Past Medical History:  Diagnosis Date   Back pain    chronic, rods in back   Hereditary peripheral neuropathy    Hypertension    Wears partial dentures    upper/ lower    Past Surgical History:  Procedure Laterality Date   BACK SURGERY     CLOSED MANIPULATION SHOULDER WITH STERIOD INJECTION Left 08/10/2022   Procedure: Left shoulder arthroscopic lysis of adhesions, capsular release, and manipulation under anesthesia with corticosteroid injection;  Surgeon: Tobie Priest, MD;  Location: [facility];  Service: Orthopedics;  Laterality: Left;   ESOPHAGOGASTRODUODENOSCOPY (EGD) WITH PROPOFOL  N/A 10/13/2018   Procedure: ESOPHAGOGASTRODUODENOSCOPY (EGD) WITH PROPOFOL ;  Surgeon: Brown Rogelia, MD;  Location: [facility];  Service: Endoscopy;  Laterality: N/A;    Prior to Admission medications   Medication Sig Start Date End Date Taking? Authorizing Provider  amLODipine  (NORVASC ) 10 MG tablet Take 1 tablet (10 mg total) by mouth daily. 10/14/18  Yes Orlean, [place], DO  meclizine  (ANTIVERT ) 12.5 MG tablet Take 2 tablets (25 mg total) by mouth 3 (three) times daily as needed for dizziness. 03/22/23  Yes Willo Dunnings, MD  ondansetron  (ZOFRAN -ODT) 4 MG disintegrating tablet Take 1 tablet (4 mg total) by mouth every 8 (eight) hours as needed for nausea or vomiting. 08/10/22  Yes Tobie Priest, MD  ondansetron  (ZOFRAN -ODT) 4 MG disintegrating tablet Take 1 tablet (4 mg total) by mouth every 8 (eight) hours as needed for nausea or vomiting. 10/09/22  Yes Lang Dover, MD  oxyCODONE  (ROXICODONE ) 15 MG immediate release tablet Take 15 mg by mouth 3 (three) times daily.    Yes [provider]  diazepam  (VALIUM ) 5 MG tablet 1 po 1 hour prior to MRI scan. May repeat x 1 right before MRI if needed. Will need driver- this can make you sleepy. Patient not taking: Reported on 08/04/2022 03/16/22   Hilma Hastings, PA-C  methocarbamol  (ROBAXIN ) 500 MG tablet Take 1 tablet (500 mg total) by mouth 4 (four) times daily. Patient not taking: Reported on 07/16/2022 03/02/22   Cuthriell, Dorn BIRCH, PA-C  methylPREDNISolone  (MEDROL  DOSEPAK) 4 MG TBPK tablet Use as directed x 6 days. Patient not taking: Reported on 07/16/2022 03/06/22   Hilma Hastings, PA-C    Allergies as of 12/16/2023 - Review Complete 03/22/2023  Allergen Reaction Noted   Amoxicillin  Hives 01/25/2016    Family History  Problem Relation Age of Onset   CAD Mother    COPD Father     Social History   Socioeconomic History   Marital status: Married    Spouse name: Not on file   Number of children: Not on file   Years of education: Not on file   Highest education level: Not on file  Occupational History   Not on file  Tobacco Use   Smoking status: Never   Smokeless tobacco: Never  Vaping Use   Vaping status: Never Used  Substance and Sexual Activity   Alcohol use: No   Drug use: No   Sexual activity: Yes  Other Topics Concern   Not on file  Social History Narrative   Not on file   Social Drivers of Corporate investment banker  Strain: Not on file  Food Insecurity: Not on file  Transportation Needs: Not on file  Physical Activity: Not on file  Stress: Not on file  Social Connections: Unknown (03/09/2022)   Received from [facility]   Social Network    Social Network: Not on file  Intimate Partner Violence: Unknown (03/09/2022)   Received from [facility]   HITS    Physically Hurt: Not on file    Insult or Talk Down To: Not on file    Threaten Physical Harm: Not on file    Scream or Curse: Not on file    Review of Systems: See HPI, otherwise negative ROS  Physical  Exam: BP 126/81   Pulse 60   Temp (!) 96.5 F (35.8 C) (Temporal)   Resp 20   Ht 5' 10 (1.778 m)   Wt 86.7 kg   SpO2 98%   BMI 27.43 kg/m  General:   Alert,  pleasant and cooperative in NAD Head:  Normocephalic and atraumatic. Neck:  Supple; no masses or thyromegaly. Lungs:  Clear throughout to auscultation.    Heart:  Regular rate and rhythm. Abdomen:  Soft, nontender and nondistended. Normal bowel sounds, without guarding, and without rebound.   Neurologic:  Alert and  oriented x4;  grossly normal neurologically.  Impression/Plan: [name] is now here to undergo a screening colonoscopy.  Risks, benefits, and alternatives regarding colonoscopy have been reviewed with the patient.  Questions have been answered.  All parties agreeable.

## 2024-01-17 NOTE — Op Note (Signed)
 [facility] Gastroenterology Patient Name: Seth Brown Procedure Date: 01/17/2024 8:31 AM MRN: [id number] Account #: [id number] Date of Birth: [birth year] Admit Type: Outpatient Age: 63 Room: [facility] ENDO ROOM 4 Gender: Male Note Status: Finalized Instrument Name: Colon Scope [phone] Procedure:             Colonoscopy Indications:           Screening for colorectal malignant neoplasm Providers:             Rogelia Copping MD, MD Referring MD:          [facility] Medicines:             Propofol  per Anesthesia Complications:         No immediate complications. Procedure:             Pre-Anesthesia Assessment:                        - Prior to the procedure, a History and Physical was                         performed, and patient medications and allergies were                         reviewed. The patient's tolerance of previous                         anesthesia was also reviewed. The risks and benefits                         of the procedure and the sedation options and risks                         were discussed with the patient. All questions were                         answered, and informed consent was obtained. Prior                         Anticoagulants: The patient has taken no anticoagulant                         or antiplatelet agents. ASA Grade Assessment: II - A                         patient with mild systemic disease. After reviewing                         the risks and benefits, the patient was deemed in                         satisfactory condition to undergo the procedure.                        After obtaining informed consent, the colonoscope was                         passed under direct vision. Throughout the procedure,  the patient's blood pressure, pulse, and oxygen                         saturations were monitored continuously. The                         Colonoscope was introduced through the anus  and                         advanced to the the cecum, identified by appendiceal                         orifice and ileocecal valve. The colonoscopy was                         performed without difficulty. The patient tolerated                         the procedure well. The quality of the bowel                         preparation was excellent. Findings:      The perianal and digital rectal examinations were normal.      Two sessile polyps were found in the transverse colon. The polyps were 2       to 4 mm in size. These polyps were removed with a cold snare. Resection       and retrieval were complete.      The exam was otherwise without abnormality. Impression:            - Two 2 to 4 mm polyps in the transverse colon,                         removed with a cold snare. Resected and retrieved.                        - The examination was otherwise normal. Recommendation:        - Discharge patient to home.                        - Resume previous diet.                        - Continue present medications.                        - Await pathology results.                        - If the pathology report reveals adenomatous tissue,                         then repeat the colonoscopy for surveillance in 7                         years. Procedure Code(s):     --- Professional ---                        [phone], Colonoscopy, flexible; with removal of  tumor(s), polyp(s), or other lesion(s) by snare                         technique Diagnosis Code(s):     --- Professional ---                        Z12.11, Encounter for screening for malignant neoplasm                         of colon                        D12.3, Benign neoplasm of transverse colon (hepatic                         flexure or splenic flexure) CPT copyright 2022 American Medical Association. All rights reserved. The codes documented in this report are preliminary and upon coder review may  be  revised to meet current compliance requirements. Rogelia Copping MD, MD 01/17/2024 8:57:22 AM This report has been signed electronically. Number of Addenda: 0 Note Initiated On: 01/17/2024 8:31 AM Scope Withdrawal Time: 0 hours 7 minutes 34 seconds  Total Procedure Duration: 0 hours 8 minutes 46 seconds  Estimated Blood Loss:  Estimated blood loss: none.      [facility]

## 2024-01-17 NOTE — Anesthesia Preprocedure Evaluation (Signed)
 Anesthesia Evaluation  Patient identified by MRN, date of birth, ID band Patient awake    Reviewed: Allergy & Precautions, NPO status , Patient's Chart, lab work & pertinent test results  History of Anesthesia Complications Negative for: history of anesthetic complications  Airway Mallampati: III  TM Distance: >3 FB Neck ROM: full    Dental  (+) Partial Upper, Partial Lower   Pulmonary neg pulmonary ROS   Pulmonary exam normal        Cardiovascular hypertension, On Medications negative cardio ROS Normal cardiovascular exam     Neuro/Psych  Neuromuscular disease  negative psych ROS   GI/Hepatic negative GI ROS, Neg liver ROS,,,  Endo/Other  negative endocrine ROS    Renal/GU negative Renal ROS  negative genitourinary   Musculoskeletal   Abdominal   Peds  Hematology negative hematology ROS (+)   Anesthesia Other Findings Past Medical History: No date: Back pain     Comment:  chronic, rods in back No date: Hereditary peripheral neuropathy No date: Hypertension No date: Wears partial dentures     Comment:  upper/ lower  Past Surgical History: No date: BACK SURGERY 08/10/2022: CLOSED MANIPULATION SHOULDER WITH STERIOD INJECTION; Left     Comment:  Procedure: Left shoulder arthroscopic lysis of               adhesions, capsular release, and manipulation under               anesthesia with corticosteroid injection;  Surgeon:               Tobie Priest, MD;  Location: [facility];                Service: Orthopedics;  Laterality: Left; 10/13/2018: ESOPHAGOGASTRODUODENOSCOPY (EGD) WITH PROPOFOL ; N/A     Comment:  Procedure: ESOPHAGOGASTRODUODENOSCOPY (EGD) WITH               PROPOFOL ;  Surgeon: Jinny Carmine, MD;  Location: [facility]               ENDOSCOPY;  Service: Endoscopy;  Laterality: N/A;     Reproductive/Obstetrics negative OB ROS                              Anesthesia  Physical Anesthesia Plan  ASA: 3  Anesthesia Plan: General   Post-op Pain Management: Minimal or no pain anticipated   Induction: Intravenous  PONV Risk Score and Plan: 1 and Propofol  infusion and TIVA  Airway Management Planned: Natural Airway and Nasal Cannula  Additional Equipment:   Intra-op Plan:   Post-operative Plan:   Informed Consent: I have reviewed the patients History and Physical, chart, labs and discussed the procedure including the risks, benefits and alternatives for the proposed anesthesia with the patient or authorized representative who has indicated his/her understanding and acceptance.     Dental Advisory Given  Plan Discussed with: Anesthesiologist, CRNA and Surgeon  Anesthesia Plan Comments: (Patient consented for risks of anesthesia including but not limited to:  - adverse reactions to medications - risk of airway placement if required - damage to eyes, teeth, lips or other oral mucosa - nerve damage due to positioning  - sore throat or hoarseness - Damage to heart, brain, nerves, lungs, other parts of body or loss of life  Patient voiced understanding and assent.)        Anesthesia Quick Evaluation

## 2024-01-17 NOTE — Anesthesia Postprocedure Evaluation (Signed)
 Anesthesia Post Note  Patient: Seth Brown  Procedure(s) Performed: COLONOSCOPY POLYPECTOMY, INTESTINE  Patient location during evaluation: Endoscopy Anesthesia Type: General Level of consciousness: awake and alert Pain management: pain level controlled Vital Signs Assessment: post-procedure vital signs reviewed and stable Respiratory status: spontaneous breathing, nonlabored ventilation, respiratory function stable and patient connected to nasal cannula oxygen Cardiovascular status: blood pressure returned to baseline and stable Postop Assessment: no apparent nausea or vomiting Anesthetic complications: no   There were no known notable events for this encounter.   Last Vitals:  Vitals:   01/17/24 0900 01/17/24 0910  BP: 103/67 109/77  Pulse: 69 (!) 57  Resp: 16 (!) 22  Temp: (!) 36.1 C   SpO2: 100% 98%    Last Pain:  Vitals:   01/17/24 0910  TempSrc:   PainSc: 0-No pain                 [name]

## 2024-01-18 LAB — SURGICAL PATHOLOGY

## 2024-01-19 ENCOUNTER — Ambulatory Visit: Payer: Self-pay | Admitting: Gastroenterology
